# Patient Record
Sex: Male | Born: 1990 | Race: White | Hispanic: No | Smoking: Never smoker
Health system: Southern US, Community
[De-identification: ages and names within clinical notes are randomized; demographics above are authoritative.]

## PROBLEM LIST (undated history)

## (undated) DIAGNOSIS — J45909 Unspecified asthma, uncomplicated: Secondary | ICD-10-CM

## (undated) HISTORY — DX: Unspecified asthma, uncomplicated: J45.909

## (undated) HISTORY — PX: CHOLECYSTECTOMY: SHX55

## (undated) HISTORY — PX: WISDOM TOOTH EXTRACTION: SHX21

---
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
## 2014-02-23 ENCOUNTER — Ambulatory Visit (INDEPENDENT_AMBULATORY_CARE_PROVIDER_SITE_OTHER): Payer: 59

## 2014-02-23 VITALS — BP 113/81 | HR 67 | Resp 18

## 2014-02-23 DIAGNOSIS — B351 Tinea unguium: Secondary | ICD-10-CM

## 2014-02-23 MED ORDER — EFINACONAZOLE 10 % EX SOLN: CUTANEOUS | Status: DC

## 2014-02-23 NOTE — Progress Notes (Signed)
   Subjective:    Patient ID: Paul Rush, male    DOB: Feb 12, 1991, 23 y.o.   MRN: [id number]  HPI I have some toenails that have some fungus on all five of my nails on the right foot and has been going on for a year and used over the counter stuff and it hasn't gone away and they are thick and discoolored    Review of Systems  HENT: Positive for sinus pressure.   All other systems reviewed and are negative.      Objective:   Physical Exam Neurovascular status is intact pedal pulses palpable epicritic and proprioceptive sensations intact and unremarkable bilateral is normal plantar response and DTRs dermatologically skin color pigment and hair growth are normal nails on both feet and back difference the right foot has yellowing white thickening discoloration and subungual onychomycosis thick nails 1 through 5 of the right foot left foot has cleared normal unremarkable nails 1 through 5 nonpainful or symptomatic the right hallux nails and lesser digit nail shows been taking yellowing discoloration tenderness both on palpation patient has tried for 2-3 months of using topical antifungal therapy with little or no success or improvement. No secondary infections no ascending cellulitis or lymphangitis, orthopedic biomechanical exam otherwise unremarkable noncontributory rectus foot type otherwise noted       Assessment & Plan:  Assessment onychomycosis mycotic nails 1 through 5 right foot with dystrophy discoloration and brittleness and friability. Plan at this time discussed options for treatment including oral medications topical antifungals OTC a prescription was as well is laser and other treatment options at this time based on my recommendation per patient request I will initiate prescription for Julia topical nail antifungal prescriptions for it to help outline pharmacy and patient will initiate daily application each affected toenail 5 great or 5 toenails of the right foot was daily for  12 months recheck in 6-12 months for followup and reassessment device may take 6-12 months before any visible changes started to become evident contact us any difficulties or skin irritation from the medication otherwise follow instructions as indicated  Alvan Dame DPM

## 2014-02-23 NOTE — Patient Instructions (Signed)

## 2016-02-13 ENCOUNTER — Encounter: Payer: Self-pay | Admitting: Sports Medicine

## 2016-02-13 ENCOUNTER — Ambulatory Visit (INDEPENDENT_AMBULATORY_CARE_PROVIDER_SITE_OTHER): Payer: 59 | Admitting: Sports Medicine

## 2016-02-13 DIAGNOSIS — L603 Nail dystrophy: Secondary | ICD-10-CM | POA: Diagnosis not present

## 2016-02-13 DIAGNOSIS — M79671 Pain in right foot: Secondary | ICD-10-CM | POA: Diagnosis not present

## 2016-02-13 NOTE — Progress Notes (Signed)
Patient ID: Paul Rush, male   DOB: Dec 06, 1990, 25 y.o.   MRN: [id number] Subjective: Paul Rush is a 25 y.o. male patient seen today in office with complaint of painful thickened and discolored nails. Patient is desiring treatment for nail changes; has tried OTC topicals and Jublia Medication for 1 year with no improvement. Reports that nails are becoming difficult to manage because of the thickness on the right foot. Patient has no other pedal complaints at this time.   There are no active problems to display for this patient.   Current Outpatient Prescriptions on File Prior to Visit  Medication Sig Dispense Refill  . Efinaconazole (JUBLIA) 10 % SOLN Apply 1 drop each affected toenail once daily for 12 months 8 mL 6   No current facility-administered medications on file prior to visit.    No Known Allergies  Objective: Physical Exam  General: Well developed, nourished, no acute distress, awake, alert and oriented x 3  Vascular: Dorsalis pedis artery 2/4 bilateral, Posterior tibial artery 2/4 bilateral, skin temperature warm to warm proximal to distal bilateral lower extremities, no varicosities, pedal hair present bilateral.  Neurological: Gross sensation present via light touch bilateral.   Dermatological: Skin is warm, dry, and supple bilateral, Nails 1-5 on right are tender, short thick, and discolored with mild subungal debris, all other nails on left foot are within normal limits, no webspace macerations present bilateral, no open lesions present bilateral, no callus/corns/hyperkeratotic tissue present bilateral. No signs of infection bilateral.  Musculoskeletal: No boney deformities noted bilateral. Muscular strength within normal limits without painon range of motion. No pain with calf compression bilateral.  Assessment and Plan:  Problem List Items Addressed This Visit    None    Visit Diagnoses    Nail dystrophy    -  Primary    1-5, right foot    Relevant  Orders    Fungus Culture with Smear    Right foot pain          -Examined patient -Discussed treatment options for painful dystrophic nails  -[facility] right 1-5 toenails -Patient to return in 4 weeks for follow up evaluation and discussion of fungal culture results or sooner if symptoms worsen.  Asencion Islamitorya Phoenix Riesen, DPM

## 2016-03-12 ENCOUNTER — Ambulatory Visit (INDEPENDENT_AMBULATORY_CARE_PROVIDER_SITE_OTHER): Payer: 59 | Admitting: Sports Medicine

## 2016-03-12 ENCOUNTER — Encounter: Payer: Self-pay | Admitting: Sports Medicine

## 2016-03-12 DIAGNOSIS — B351 Tinea unguium: Secondary | ICD-10-CM | POA: Diagnosis not present

## 2016-03-12 DIAGNOSIS — M79671 Pain in right foot: Secondary | ICD-10-CM | POA: Diagnosis not present

## 2016-03-12 NOTE — Progress Notes (Signed)
Patient ID: Paul Rush, male   DOB: Feb 27, 1991, 25 y.o.   MRN: [id number] Subjective: Paul Rush is a 25 y.o. male patient seen today in office for fungal culture results. Patient has no other pedal complaints at this time.   There are no active problems to display for this patient.   Current Outpatient Prescriptions on File Prior to Visit  Medication Sig Dispense Refill  . chlorhexidine (PERIDEX) 0.12 % solution RINSE SS OZ TWICE DAILY AS DIRECTED  0  . Efinaconazole (JUBLIA) 10 % SOLN Apply 1 drop each affected toenail once daily for 12 months 8 mL 6  . HYDROcodone-acetaminophen (NORCO) 7.5-325 MG tablet TK 1 T PO  Q 4-6 H PRN P  0  . ibuprofen (ADVIL,MOTRIN) 600 MG tablet   0  . promethazine (PHENERGAN) 25 MG tablet TK 1 T PO  Q 6-8 H PRN FOR NAUSEA  0  . triazolam (HALCION) 0.25 MG tablet TAKE 1 TABLET THE EVENING PRIOR TO SURGERY AT BEDTIME AND SECOND TABLET 45 MINUTES PRIOR TO SURGERY  0   No current facility-administered medications on file prior to visit.    No Known Allergies  Objective: Physical Exam  General: Well developed, nourished, no acute distress, awake, alert and oriented x 3  Vascular: Dorsalis pedis artery 2/4 bilateral, Posterior tibial artery 2/4 bilateral, skin temperature warm to warm proximal to distal bilateral lower extremities, no varicosities, pedal hair present bilateral.  Neurological: Gross sensation present via light touch bilateral.   Dermatological: Skin is warm, dry, and supple bilateral, Nails 1-5 are tender, short thick, and discolored with mild subungal debris on right, all nails on left are within normal limits, no webspace macerations present bilateral, no open lesions present bilateral, no callus/corns/hyperkeratotic tissue present bilateral. No signs of infection bilateral.  Musculoskeletal: No boney deformities noted bilateral. Muscular strength within normal limits without painon range of motion. No pain with calf compression  bilateral.  Fungal culture + T. Rubrum  Assessment and Plan:  Problem List Items Addressed This Visit    None    Visit Diagnoses    Right foot pain    -  Primary    Nail fungus        Relevant Orders    Hepatic Function Panel      -Examined patient -Discussed treatment options for painful mycotic nails -Patient opt for oral Lamisil with full understanding of medication risks; ordered LFTs for review if within normal limits will proceed with sending Rx to pharmacy for lamisil 250mg  PO daily. Anticipate 12 week course.  -Advised good hygiene habits -Patient to return in 6 weeks for follow up evaluation or sooner if symptoms worsen.  Paul Rush, DPM

## 2016-03-19 ENCOUNTER — Ambulatory Visit (INDEPENDENT_AMBULATORY_CARE_PROVIDER_SITE_OTHER): Payer: 59 | Admitting: Neurology

## 2016-03-19 ENCOUNTER — Encounter: Payer: Self-pay | Admitting: Neurology

## 2016-03-19 VITALS — BP 120/72 | HR 86 | Resp 20 | Ht 66.0 in | Wt 140.0 lb

## 2016-03-19 DIAGNOSIS — R208 Other disturbances of skin sensation: Secondary | ICD-10-CM | POA: Diagnosis not present

## 2016-03-19 DIAGNOSIS — R6884 Jaw pain: Secondary | ICD-10-CM | POA: Diagnosis not present

## 2016-03-19 DIAGNOSIS — R200 Anesthesia of skin: Secondary | ICD-10-CM

## 2016-03-19 MED ORDER — CLONAZEPAM 0.5 MG PO TABS: 0.5000 mg | ORAL_TABLET | Freq: Every day | ORAL | Status: DC

## 2016-03-19 NOTE — Progress Notes (Signed)
Provider:  Melvyn Novas, M D  Referring Provider: Abigail Miyamoto,* Primary Care Physician:  Abigail Miyamoto, MD  Chief Complaint  Patient presents with  . New Patient (Initial Visit)    numbness on gumline after surgery    HPI:  Paul Rush is a 25 y.o. male  Is seen here as a referral from Dr. Ebbie Ridge in [place], DMD, DDS.      Paul Rush underwent on 02/18/2016 evaluation by Dr Ebbie Ridge  for atypical gum sensation. These affect the left and right anterior mandibular area a tingling likened to a numbing medication. It began more than 2 weeks after the extraction of his wisdom teeth had taken place. This was on 01/18/2016 the extraction was for bilateral third molars or wisdom teeth. He reportedly started having an abnormal sensation as soon as his aneasthesia wore off.  He was given a referral to an endodontist by his primary dentist  to rule out  dental origin of the discomfort and had been given prescription for muscle relaxant, this  made him just drowsy but did not significantly alter the sensory disturbance.   There were no intraoperative complications noted. The gingival tissues appeared unaffected. Dr. Ebbie Ridge than prescribed 100 mg of gabapentin 3 times a day which helped " to take the edge off "but did not completely resolve the symptoms. He had slowly titrated to a final level of 300 mg 3 times a day which completely resolved the discomfort but that result was short-lived as he tried to wean off gabapentin. The patient has not reported any numbness to the tongue mobile culture she seems to be strictly the gingiva. He also reports no change in his taste and smell sensations. Just a couple of days ago with discomfort was so painful in quality and intensity , he was nauseated. He likens the sensation to having food residual stuck between his teeth irritating the gumline. It is unpleasant to the level of being painful but there is also a  baseline numbness. By evaluating the oral cavity sensation with Q-tips I find no evidence that his buccal tissue is numb that he has lost taste or smell sensation, and the inner and outer side of the gumline is affected only at the mandibular branch.   Review of Systems: Out of a complete 14 system review, the patient complains of only the following symptoms, and all other reviewed systems are negative.  gum dysesthesia, hypersensitivity.  Hyperpathia  Social History   Social History  . Marital Status: Single    Spouse Name: N/A  . Number of Children: N/A  . Years of Education: N/A   Occupational History  . Not on file.   Social History Main Topics  . Smoking status: Never Smoker   . Smokeless tobacco: Never Used  . Alcohol Use: 1.2 oz/week    2 Standard drinks or equivalent per week  . Drug Use: No  . Sexual Activity: Not on file   Other Topics Concern  . Not on file   Social History Narrative   Drinks 1-2 cups of caffeine daily.    Family History  Problem Relation Age of Onset  . Healthy Mother     No past medical history on file.  Past Surgical History  Procedure Laterality Date  . Wisdom tooth extraction    . Cholecystectomy      Current Outpatient Prescriptions  Medication Sig Dispense Refill  . gabapentin (NEURONTIN) 300 MG capsule Take 300 mg by  mouth 3 (three) times daily.    Marland Kitchen ibuprofen (ADVIL,MOTRIN) 600 MG tablet   0   No current facility-administered medications for this visit.    Allergies as of 03/19/2016  . (No Known Allergies)    Vitals: BP 120/72 mmHg  Pulse 86  Resp 20  Ht 5\' 6"  (1.676 m)  Wt 140 lb (63.504 kg)  BMI 22.61 kg/m2 Last Weight:  Wt Readings from Last 1 Encounters:  03/19/16 140 lb (63.504 kg)   Last Height:   Ht Readings from Last 1 Encounters:  03/19/16 5\' 6"  (1.676 m)    Physical exam:  General: The patient is awake, alert and appears not in acute distress. The patient is well groomed. Head: Normocephalic,  atraumatic. Neck is supple. Mallampati 1 , neck circumference:14 Cardiovascular:  Regular rate and rhythm, without  murmurs or carotid bruit, and without distended neck veins. Respiratory: Lungs are clear to auscultation. Skin:  Without evidence of edema, or rash Trunk:  normal posture.  Neurologic exam : The patient is awake and alert, oriented to place and time.  Memory subjective  described as intact. There is a normal attention span & concentration ability. Speech is fluent without dysarthria, dysphonia or aphasia. There is no dysphagia reported, hisvoice hasn't changed Mood and affect are appropriate.  Cranial nerves: Pupils are equal and briskly reactive to light. Funduscopic exam without evidence of pallor or edema. Extraocular movements  in vertical and horizontal planes with coarse saccades, non-smooth pursuit.  and not a  nystagmus.  Visual fields by finger perimetry are intact. Hearing to finger rub intact.  Facial sensation intact to fine touch.   Facial motor strength is symmetric and tongue and uvula move midline.   Tongue protrusion into either cheek is normal. The numbness affects the gingiva at the inner and outer side of the lower jaw but does not ascend into the buccal Tissue. There is no numbness on the outside of the face. There is no numbness  involving the maxilla or soft palate. Shoulder shrug is normal.   Motor exam:   Normal tone ,muscle bulk and symmetric  strength in all extremities.  Sensory:  Fine touch, pinprick and vibration were tested in all extremities. Proprioception was normal.  Coordination: Rapid alternating movements in the fingers/hands were normal. Finger-to-nose maneuver  normal without evidence of ataxia, dysmetria or tremor.  Gait and station: Patient walks without assistive device and is able unassisted to climb up to the exam table. Strength within normal limits. Stance is stable and normal.   Assessment:  After physical and neurologic  examination, review of laboratory studies, imaging, neurophysiology testing and pre-existing records, assessment is that of :  Paul Rush reported the onset of the normal mandibular gum sensation right after the anesthesia wore off, and I don't think it is related to the dental extraction per se as much as it could be related to the anesthesia. If his mandibular branch nerve was damaged it is likely to recover after valerian degeneration at about 1 inch per month. This means that was in 2 or 3 months is gumline should just slowly regain normal sensation. I would very much like to obtain an MRI  of the brainstem, CN origin and down to the parotid gland. I would recommend this if no improvement is noted in 6 weeks, but the patient is anxious.     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       Porfirio Mylararmen Verl Whitmore MD 03/19/2016

## 2016-03-19 NOTE — Addendum Note (Signed)
Addended by: Margo AyeLOGAN, Ethaniel Garfield L on: 03/19/2016 04:00 PM   Modules accepted: Orders

## 2016-03-19 NOTE — Addendum Note (Signed)
Addended by: Melvyn NovasHMEIER, Dillian Feig on: 03/19/2016 02:52 PM   Modules accepted: Orders

## 2016-03-19 NOTE — Addendum Note (Signed)
Addended by: Geronimo RunningINKINS, Kashia Brossard A on: 03/19/2016 03:59 PM   Modules accepted: Orders

## 2016-03-21 LAB — CBC WITH DIFFERENTIAL/PLATELET
BASOS ABS: 0 10*3/uL (ref 0.0–0.2)
Basos: 0 %
EOS (ABSOLUTE): 0 10*3/uL (ref 0.0–0.4)
Eos: 1 %
HEMOGLOBIN: 16.5 g/dL (ref 12.6–17.7)
Hematocrit: 47.6 % (ref 37.5–51.0)
Immature Grans (Abs): 0 10*3/uL (ref 0.0–0.1)
Immature Granulocytes: 0 %
LYMPHS ABS: 2.3 10*3/uL (ref 0.7–3.1)
Lymphs: 33 %
MCH: 30.8 pg (ref 26.6–33.0)
MCHC: 34.7 g/dL (ref 31.5–35.7)
MCV: 89 fL (ref 79–97)
MONOCYTES: 6 %
MONOS ABS: 0.4 10*3/uL (ref 0.1–0.9)
NEUTROS ABS: 4.2 10*3/uL (ref 1.4–7.0)
Neutrophils: 60 %
Platelets: 217 10*3/uL (ref 150–379)
RBC: 5.36 x10E6/uL (ref 4.14–5.80)
RDW: 14 % (ref 12.3–15.4)
WBC: 7 10*3/uL (ref 3.4–10.8)

## 2016-03-21 LAB — COMPREHENSIVE METABOLIC PANEL
ALT: 12 IU/L (ref 0–44)
AST: 17 IU/L (ref 0–40)
Albumin/Globulin Ratio: 1.8 (ref 1.2–2.2)
Albumin: 5 g/dL (ref 3.5–5.5)
Alkaline Phosphatase: 82 IU/L (ref 39–117)
BUN/Creatinine Ratio: 12 (ref 9–20)
BUN: 10 mg/dL (ref 6–20)
Bilirubin Total: 1.2 mg/dL (ref 0.0–1.2)
CALCIUM: 10 mg/dL (ref 8.7–10.2)
CO2: 26 mmol/L (ref 18–29)
CREATININE: 0.81 mg/dL (ref 0.76–1.27)
Chloride: 97 mmol/L (ref 96–106)
GFR calc Af Amer: 141 mL/min/{1.73_m2} (ref >59)
GFR, EST NON AFRICAN AMERICAN: 122 mL/min/{1.73_m2} (ref >59)
GLOBULIN, TOTAL: 2.8 g/dL (ref 1.5–4.5)
GLUCOSE: 68 mg/dL (ref 65–99)
Potassium: 4.4 mmol/L (ref 3.5–5.2)
SODIUM: 140 mmol/L (ref 134–144)
Total Protein: 7.8 g/dL (ref 6.0–8.5)

## 2016-03-21 LAB — B. BURGDORFI ANTIBODIES

## 2016-03-24 ENCOUNTER — Telehealth: Payer: Self-pay

## 2016-03-24 NOTE — Telephone Encounter (Signed)
I spoke to Paul Rush and advised him that his labs were normal. Paul Rush asked that I sent a copy to Dr. Marina GoodellPerry, PCP. Paul Rush verbalized understanding of results. Paul Rush had no questions at this time but was encouraged to call back if questions arise.

## 2016-03-24 NOTE — Telephone Encounter (Signed)
-----   Message from Melvyn Novasarmen Dohmeier, MD sent at 03/21/2016  1:13 PM EDT ----- Mr Gregery NaKleisner has utstanding , normal CMET and CBC results. Lyme test negative - Copy to PCP, please. CD

## 2016-03-27 ENCOUNTER — Telehealth: Payer: Self-pay | Admitting: Neurology

## 2016-03-27 NOTE — Telephone Encounter (Signed)
Pt called in and states that his pain is moving to right side of jaw and down his neck. Sleep medication is not working and says that he is in pain all the time. Please call and advise [phone]

## 2016-03-27 NOTE — Telephone Encounter (Signed)
I spoke to pt. He says that the gabapentin does help him, but he still having pain that is bothersome to him. He would be willing to try an increase in gabapentin dose if Dr. Terrace ArabiaYan feels this is appropriate.

## 2016-03-27 NOTE — Telephone Encounter (Signed)
I spoke to pt. He is having pain now in his right jaw and down his right next. Pt woke up this morning and was nauseous from the pain. He says that the klonopin 0.5mg  tablet qhs does not help his pain, only makes him relax before bedtime. Pt feels that the gabapentin has not been beneficial for his pain. He has not been scheduled for his MRI.   Pt is wondering if there is anything else he can take for his pain. I advised him that Dr. Vickey Hugerohmeier is out of the office until mid July and I will send this request to the work-in physician. Pt verbalized understanding.  Duwayne HeckDanielle, can you please get this pt scheduled for his MRI? He says that he has not heard anything from our office regarding that yet.

## 2016-03-27 NOTE — Telephone Encounter (Addendum)
Chart reviewed, patient was seen by Dr. Vickey Hugerohmeier on June 21st 2017, for gumline neuropathic pain, he was given prescription of gabapentin 300 mg 3 times a day with limited help  Please check to see if gabapentin help him at all, it is helping him some, he still have a lot of room to push up the dosage, he may take gabapentin 300 mg 2 tablets 3 times a day,  If gabapentin does not help him at all, the other option would be switched to different medication such as Lyrica, which is a brand name medication, 100 mg 3 times a day.

## 2016-03-28 MED ORDER — GABAPENTIN 300 MG PO CAPS: 600.0000 mg | ORAL_CAPSULE | Freq: Three times a day (TID) | ORAL | Status: DC

## 2016-03-28 NOTE — Telephone Encounter (Signed)
I spoke to pt and realyed the instructions per below and new prescription called in for him.  Gabapentin 300mg  2 tabs po tid.   He verbalized understanding.

## 2016-03-28 NOTE — Addendum Note (Signed)
Addended by: Levert FeinsteinYAN, Della Homan on: 03/28/2016 10:08 AM   Modules accepted: Orders

## 2016-03-28 NOTE — Telephone Encounter (Signed)
Please call patient, it is ok to take higher dose of gabapentin, I have called in Gabapentin 300 mg 2 tabs tid.

## 2016-04-07 NOTE — Telephone Encounter (Signed)
[facility]

## 2016-04-09 ENCOUNTER — Ambulatory Visit (INDEPENDENT_AMBULATORY_CARE_PROVIDER_SITE_OTHER): Payer: 59 | Admitting: Adult Health

## 2016-04-09 ENCOUNTER — Encounter: Payer: Self-pay | Admitting: Adult Health

## 2016-04-09 VITALS — BP 108/59 | HR 77 | Ht 66.0 in | Wt 148.8 lb

## 2016-04-09 DIAGNOSIS — R208 Other disturbances of skin sensation: Secondary | ICD-10-CM

## 2016-04-09 DIAGNOSIS — R200 Anesthesia of skin: Secondary | ICD-10-CM

## 2016-04-09 NOTE — Progress Notes (Signed)
PATIENT: Paul Rush DOB: 04-17-1991  REASON FOR VISIT: follow up- sensory loss HISTORY FROM: patient  HISTORY OF PRESENT ILLNESS: Paul Rush is a 25 year old male with a history of sensory changes in the mandibular area following dental surgery. He returns today for follow-up. He states that he called in earlier regarding discomfort and gabapentin was increased. He states that this has been very beneficial. He states that he continues to have some discomfort but it is tolerable. He continues to use Klonopin at bedtime with good benefit. Patient has an MRI scheduled for Saturday. He denies any new neurological symptoms. Returns today for an evaluation.   HISTORY 03/19/16 [facility]):  Paul Rush underwent on 02/18/2016 evaluation by Dr Ebbie Ridge for atypical gum sensation. These affect the left and right anterior mandibular area a tingling likened to a numbing medication. It began more than 2 weeks after the extraction of his wisdom teeth had taken place. This was on 01/18/2016 the extraction was for bilateral third molars or wisdom teeth. He reportedly started having an abnormal sensation as soon as his aneasthesia wore off.  He was given a referral to an endodontist by his primary dentist to rule out dental origin of the discomfort and had been given prescription for muscle relaxant, this made him just drowsy but did not significantly alter the sensory disturbance.  There were no intraoperative complications noted. The gingival tissues appeared unaffected. Dr. Ebbie Ridge than prescribed 100 mg of gabapentin 3 times a day which helped " to take the edge off "but did not completely resolve the symptoms. He had slowly titrated to a final level of 300 mg 3 times a day which completely resolved the discomfort but that result was short-lived as he tried to wean off gabapentin. The patient has not reported any numbness to the tongue mobile culture she seems to be strictly the gingiva.  He also reports no change in his taste and smell sensations. Just a couple of days ago with discomfort was so painful in quality and intensity , he was nauseated. He likens the sensation to having food residual stuck between his teeth irritating the gumline. It is unpleasant to the level of being painful but there is also a baseline numbness. By evaluating the oral cavity sensation with Q-tips I find no evidence that his buccal tissue is numb that he has lost taste or smell sensation, and the inner and outer side of the gumline is affected only at the mandibular branch.  REVIEW OF SYSTEMS: Out of a complete 14 system review of symptoms, the patient complains only of the following symptoms, and all other reviewed systems are negative.  See history of present illness  ALLERGIES: No Known Allergies  HOME MEDICATIONS: Outpatient Prescriptions Prior to Visit  Medication Sig Dispense Refill  . clonazePAM (KLONOPIN) 0.5 MG tablet Take 1 tablet (0.5 mg total) by mouth at bedtime. 30 tablet 0  . gabapentin (NEURONTIN) 300 MG capsule Take 2 capsules (600 mg total) by mouth 3 (three) times daily. 180 capsule 6  . ibuprofen (ADVIL,MOTRIN) 600 MG tablet   0   No facility-administered medications prior to visit.    PAST MEDICAL HISTORY: No past medical history on file.  PAST SURGICAL HISTORY: Past Surgical History  Procedure Laterality Date  . Wisdom tooth extraction    . Cholecystectomy      FAMILY HISTORY: Family History  Problem Relation Age of Onset  . Healthy Mother     SOCIAL HISTORY: Social History   Social  History  . Marital Status: Single    Spouse Name: N/A  . Number of Children: N/A  . Years of Education: N/A   Occupational History  . Not on file.   Social History Main Topics  . Smoking status: Never Smoker   . Smokeless tobacco: Never Used  . Alcohol Use: 1.2 oz/week    2 Standard drinks or equivalent per week  . Drug Use: No  . Sexual Activity: Not on file    Other Topics Concern  . Not on file   Social History Narrative   Drinks 1-2 cups of caffeine daily.      PHYSICAL EXAM  Filed Vitals:   04/09/16 0856  BP: 108/59  Pulse: 77  Height: 5\' 6"  (1.676 m)  Weight: 148 lb 12.8 oz (67.495 kg)   Body mass index is 24.03 kg/(m^2).  Generalized: Well developed, in no acute distress   Neurological examination  Mentation: Alert oriented to time, place, history taking. Follows all commands speech and language fluent Cranial nerve II-XII: Pupils were equal round reactive to light. Extraocular movements were full, visual field were full on confrontational test. Facial sensation and strength were normal. Uvula tongue midline. Head turning and shoulder shrug  were normal and symmetric. Motor: The motor testing reveals 5 over 5 strength of all 4 extremities. Good symmetric motor tone is noted throughout.  Sensory: Sensory testing is intact to soft touch on all 4 extremities. No evidence of extinction is noted.  Coordination: Cerebellar testing reveals good finger-nose-finger and heel-to-shin bilaterally.  Gait and station: Gait is normal. Tandem gait is normal. Romberg is negative. No drift is seen.  Reflexes: Deep tendon reflexes are symmetric and normal bilaterally.   DIAGNOSTIC DATA (LABS, IMAGING, TESTING) - I reviewed patient records, labs, notes, testing and imaging myself where available.  Lab Results  Component Value Date   WBC 7.0 03/19/2016   HCT 47.6 03/19/2016   MCV 89 03/19/2016   PLT 217 03/19/2016      Component Value Date/Time   NA 140 03/19/2016 1525   K 4.4 03/19/2016 1525   CL 97 03/19/2016 1525   CO2 26 03/19/2016 1525   GLUCOSE 68 03/19/2016 1525   BUN 10 03/19/2016 1525   CREATININE 0.81 03/19/2016 1525   CALCIUM 10.0 03/19/2016 1525   PROT 7.8 03/19/2016 1525   ALBUMIN 5.0 03/19/2016 1525   AST 17 03/19/2016 1525   ALT 12 03/19/2016 1525   ALKPHOS 82 03/19/2016 1525   BILITOT 1.2 03/19/2016 1525    GFRNONAA 122 03/19/2016 1525   GFRAA 141 03/19/2016 1525      ASSESSMENT AND PLAN 25 y.o. year old male  has no past medical history on file. here with:  1. Sensory loss  The patient's physical exam was relatively unremarkable. He will continue on gabapentin 600 mg 3 times a day. Continue on Klonopin at bedtime. He has his MRI scheduled for Saturday. We will call him with results once they are available to us. Patient advised that if his symptoms worsen or he develops any new symptoms he should let us know. Will follow-up in 3 months or sooner if needed.    Butch PennyMegan Haiden Clucas, MSN, NP-C 04/09/2016, 9:11 AM [facility] [address], [address] [place], [place] [address] [phone]

## 2016-04-09 NOTE — Progress Notes (Signed)
I agree with the assessment and plan as directed by NP .The patient is known to me .   Hind Chesler, MD  

## 2016-04-09 NOTE — Patient Instructions (Signed)
Continue gabapentin and klonopin  If your symptoms worsen or you develop new symptoms please let us know.

## 2016-04-12 ENCOUNTER — Ambulatory Visit
Admission: RE | Admit: 2016-04-12 | Discharge: 2016-04-12 | Disposition: A | Discharge status: Home / Self Care | Payer: 59 | Source: Ambulatory Visit | Attending: Neurology | Admitting: Neurology

## 2016-04-12 DIAGNOSIS — R208 Other disturbances of skin sensation: Secondary | ICD-10-CM | POA: Diagnosis not present

## 2016-04-12 DIAGNOSIS — R6884 Jaw pain: Secondary | ICD-10-CM

## 2016-04-12 DIAGNOSIS — R200 Anesthesia of skin: Secondary | ICD-10-CM

## 2016-04-12 MED ORDER — GADOBENATE DIMEGLUMINE 529 MG/ML IV SOLN
13.0000 mL | Freq: Once | INTRAVENOUS | Status: AC | PRN
  Administered 2016-04-12: 13 mL via INTRAVENOUS

## 2016-04-16 ENCOUNTER — Telehealth: Payer: Self-pay

## 2016-04-16 NOTE — Telephone Encounter (Signed)
-----   Message from Melvyn Novasarmen Dohmeier, MD sent at 04/16/2016 11:29 AM EDT ----- normal brain MRI, CD

## 2016-04-16 NOTE — Telephone Encounter (Signed)
I spoke to patient and he is aware of results/.  

## 2016-04-23 ENCOUNTER — Ambulatory Visit (INDEPENDENT_AMBULATORY_CARE_PROVIDER_SITE_OTHER): Payer: 59 | Admitting: Sports Medicine

## 2016-04-23 ENCOUNTER — Encounter: Payer: Self-pay | Admitting: Sports Medicine

## 2016-04-23 DIAGNOSIS — L603 Nail dystrophy: Secondary | ICD-10-CM

## 2016-04-23 DIAGNOSIS — R61 Generalized hyperhidrosis: Secondary | ICD-10-CM

## 2016-04-23 DIAGNOSIS — B351 Tinea unguium: Secondary | ICD-10-CM | POA: Diagnosis not present

## 2016-04-23 DIAGNOSIS — L74519 Primary focal hyperhidrosis, unspecified: Secondary | ICD-10-CM

## 2016-04-23 DIAGNOSIS — M79671 Pain in right foot: Secondary | ICD-10-CM

## 2016-04-23 MED ORDER — TERBINAFINE HCL 250 MG PO TABS: 250.0000 mg | ORAL_TABLET | Freq: Every day | ORAL | 0 refills | Status: DC

## 2016-04-23 NOTE — Patient Instructions (Signed)
Preventing Toenail Fungus from Recurring   Sanitize your shoes with Mycomist spray or a similar shoe sanitizer spray.  Follow the instructions on the bottle and dry them outside in the sun or with a hairdryer.  We also recommend repeating the sanitization once weekly in shoes you wear most often.   Throw away any shoes you have worn a significant amount without socks-fungus thrives in a warm moist environment and you want to avoid re-infection after your laser procedure   Bleach your socks with regular or color safe bleach   Change your socks regularly to keep your feet clean and dry (especially if you have sweaty feet)-if sweaty feet are a problem, let your doctor know-there is a great lotion that helps with this problem.   Clean your toenail clippers with alcohol before you use them if you do your own toenails and make sure to replace [employer] boards and orange sticks regularly   If you get regular pedicures, bring your own instruments or go to a spa that sterilizes their instruments in an autoclave.  Onychomycosis/Fungal Toenails  WHAT IS IT? An infection that lies within the keratin of your nail plate that is caused by a fungus.  WHY ME? Fungal infections affect all ages, sexes, races, and creeds.  There may be many factors that predispose you to a fungal infection such as age, coexisting medical conditions such as diabetes, or an autoimmune disease; stress, medications, fatigue, genetics, etc.  Bottom line: fungus thrives in a warm, moist environment and your shoes offer such a location.  IS IT CONTAGIOUS? Theoretically, yes.  You do not want to share shoes, nail clippers or files with someone who has fungal toenails.  Walking around barefoot in the same room or sleeping in the same bed is unlikely to transfer the organism.  It is important to realize, however, that fungus can spread easily from one nail to the next on the same foot.  HOW DO WE TREAT THIS?  There are several ways to  treat this condition.  Treatment may depend on many factors such as age, medications, pregnancy, liver and kidney conditions, etc.  It is best to ask your doctor which options are available to you.  2. No treatment.   Unlike many other medical concerns, you can live with this condition.  However for many people this can be a painful condition and may lead to ingrown toenails or a bacterial infection.  It is recommended that you keep the nails cut short to help reduce the amount of fungal nail. 3. Topical treatment.  These range from herbal remedies to prescription strength nail lacquers.  About 40-50% effective, topicals require twice daily application for approximately 9 to 12 months or until an entirely new nail has grown out.  The most effective topicals are medical grade medications available through physicians offices. 4. Oral antifungal medications.  With an 80-90% cure rate, the most common oral medication requires 3 to 4 months of therapy and stays in your system for a year as the new nail grows out.  Oral antifungal medications do require blood work to make sure it is a safe drug for you.  A liver function panel will be performed prior to starting the medication and after the first month of treatment.  It is important to have the blood work performed to avoid any harmful side effects.  In general, this medication safe but blood work is required. 5. Laser Therapy.  This treatment is performed by applying a specialized   laser to the affected nail plate.  This therapy is noninvasive, fast, and non-painful.  It is not covered by insurance and is therefore, out of pocket.  The results have been very good with a 80-95% cure rate.  The [facility] is the only practice in the area to offer this therapy. 6. Permanent Nail Avulsion.  Removing the entire nail so that a new nail will not grow back.  

## 2016-04-23 NOTE — Progress Notes (Signed)
Subjective: [name] is a 25 y.o. male patient seen today in office for follow evaluation of right nail fungus and fungal culture results. Patient has no other pedal complaints at this time.   There are no active problems to display for this patient.   Current Outpatient Prescriptions on File Prior to Visit  Medication Sig Dispense Refill  . clonazePAM (KLONOPIN) 0.5 MG tablet Take 1 tablet (0.5 mg total) by mouth at bedtime. 30 tablet 0  . gabapentin (NEURONTIN) 300 MG capsule Take 2 capsules (600 mg total) by mouth 3 (three) times daily. 180 capsule 6  . ibuprofen (ADVIL,MOTRIN) 600 MG tablet   0   No current facility-administered medications on file prior to visit.     No Known Allergies  Objective: Physical Exam  General: Well developed, nourished, no acute distress, awake, alert and oriented x 3  Vascular: Dorsalis pedis artery 2/4 bilateral, Posterior tibial artery 2/4 bilateral, skin temperature warm to warm proximal to distal bilateral lower extremities, no varicosities, pedal hair present bilateral. Mild increased moisture to both feet suggestive of hyperhydrosis.  Neurological: Gross sensation present via light touch bilateral.   Dermatological: Skin is warm, dry, and supple bilateral, Nails 1-10 are tender, short thick, and discolored with mild subungal debris R>L, no webspace macerations present bilateral, no open lesions present bilateral, no callus/corns/hyperkeratotic tissue present bilateral. No signs of infection bilateral.  Musculoskeletal: No symptomatic boney deformities noted bilateral. Muscular strength within normal limits without painon range of motion. No pain with calf compression bilateral.  Fungal culture + T Rubrum  Assessment and Plan:  Problem List Items Addressed This Visit    None    Visit Diagnoses    Nail fungus    -  Primary   Relevant Medications   terbinafine (LAMISIL) 250 MG tablet   Nail dystrophy       Right foot pain       Hyperhydrosis disorder         -Examined patient -Discussed treatment options for painful mycotic nails -Patient opt for oral Lamisil with full understanding of medication risks; LFTs from neurologist within normal limits will proceed with sending Rx to pharmacy for lamisil 250mg  PO daily. Anticipate 12 week course.  -Patient to continue with topical jublia as well of which he already has -Advised good hygiene habits -Patient to return in 3-4 months when new insurance is active for follow up evaluation or sooner if symptoms worsen.  Asencion Islam, DPM

## 2016-07-10 ENCOUNTER — Ambulatory Visit: Payer: 59 | Admitting: Adult Health

## 2018-04-06 ENCOUNTER — Emergency Department (HOSPITAL_COMMUNITY): Payer: BC Managed Care – PPO

## 2018-04-06 ENCOUNTER — Emergency Department (HOSPITAL_COMMUNITY)
Admission: EM | Admit: 2018-04-06 | Discharge: 2018-04-06 | Disposition: A | Discharge status: Home / Self Care | Payer: BC Managed Care – PPO | Attending: Emergency Medicine | Admitting: Emergency Medicine

## 2018-04-06 ENCOUNTER — Encounter (HOSPITAL_COMMUNITY): Payer: Self-pay

## 2018-04-06 DIAGNOSIS — R079 Chest pain, unspecified: Secondary | ICD-10-CM | POA: Insufficient documentation

## 2018-04-06 DIAGNOSIS — K529 Noninfective gastroenteritis and colitis, unspecified: Secondary | ICD-10-CM | POA: Insufficient documentation

## 2018-04-06 DIAGNOSIS — R509 Fever, unspecified: Secondary | ICD-10-CM | POA: Diagnosis not present

## 2018-04-06 DIAGNOSIS — R109 Unspecified abdominal pain: Secondary | ICD-10-CM | POA: Diagnosis not present

## 2018-04-06 DIAGNOSIS — R197 Diarrhea, unspecified: Secondary | ICD-10-CM | POA: Diagnosis present

## 2018-04-06 LAB — BASIC METABOLIC PANEL
Anion gap: 14 (ref 5–15)
BUN: 11 mg/dL (ref 6–20)
CO2: 23 mmol/L (ref 22–32)
CREATININE: 0.93 mg/dL (ref 0.61–1.24)
Calcium: 9.6 mg/dL (ref 8.9–10.3)
Chloride: 102 mmol/L (ref 98–111)
Glucose, Bld: 84 mg/dL (ref 70–99)
Potassium: 3.4 mmol/L — ABNORMAL LOW (ref 3.5–5.1)
SODIUM: 139 mmol/L (ref 135–145)

## 2018-04-06 LAB — D-DIMER, QUANTITATIVE (NOT AT ARMC)

## 2018-04-06 LAB — I-STAT TROPONIN, ED: TROPONIN I, POC: 0 ng/mL (ref 0.00–0.08)

## 2018-04-06 LAB — CBC
HCT: 42.5 % (ref 39.0–52.0)
Hemoglobin: 14.2 g/dL (ref 13.0–17.0)
MCH: 29.7 pg (ref 26.0–34.0)
MCHC: 33.4 g/dL (ref 30.0–36.0)
MCV: 88.9 fL (ref 78.0–100.0)
PLATELETS: 228 10*3/uL (ref 150–400)
RBC: 4.78 MIL/uL (ref 4.22–5.81)
RDW: 12.4 % (ref 11.5–15.5)
WBC: 13.6 10*3/uL — AB (ref 4.0–10.5)

## 2018-04-06 MED ORDER — SODIUM CHLORIDE 0.9 % IV BOLUS
1000.0000 mL | Freq: Once | INTRAVENOUS | Status: AC
  Administered 2018-04-06: 1000 mL via INTRAVENOUS

## 2018-04-06 MED ORDER — ACETAMINOPHEN 500 MG PO TABS
1000.0000 mg | ORAL_TABLET | Freq: Once | ORAL | Status: AC
  Administered 2018-04-06: 1000 mg via ORAL
  Filled 2018-04-06: qty 2

## 2018-04-06 MED ORDER — KETOROLAC TROMETHAMINE 15 MG/ML IJ SOLN
15.0000 mg | Freq: Once | INTRAMUSCULAR | Status: AC
  Administered 2018-04-06: 15 mg via INTRAVENOUS
  Filled 2018-04-06: qty 1

## 2018-04-06 MED ORDER — METRONIDAZOLE 500 MG PO TABS: 500.0000 mg | ORAL_TABLET | Freq: Three times a day (TID) | ORAL | 0 refills | Status: AC

## 2018-04-06 MED ORDER — IOHEXOL 300 MG/ML  SOLN
100.0000 mL | Freq: Once | INTRAMUSCULAR | Status: AC | PRN
  Administered 2018-04-06: 100 mL via INTRAVENOUS

## 2018-04-06 MED ORDER — CIPROFLOXACIN HCL 500 MG PO TABS: 500.0000 mg | ORAL_TABLET | Freq: Two times a day (BID) | ORAL | 0 refills | Status: AC

## 2018-04-06 MED ORDER — POTASSIUM CHLORIDE CRYS ER 20 MEQ PO TBCR
40.0000 meq | EXTENDED_RELEASE_TABLET | Freq: Once | ORAL | Status: AC
Start: 2018-04-06 — End: 2018-04-06
  Administered 2018-04-06: 40 meq via ORAL
  Filled 2018-04-06: qty 2

## 2018-04-06 NOTE — ED Provider Notes (Signed)
[facility] EMERGENCY DEPARTMENT Provider Note   CSN: [id number] Arrival date & time: 04/06/18  1532     History   Chief Complaint Chief Complaint  Patient presents with  . Chest Pain    HPI Paul Rush is a 27 y.o. male.  HPI 27 year old male with no significant past medical history here with fever, nausea, and diarrhea.  Patient states that for the last week, he has had persistent diarrhea.  He traveled to [place] several weeks ago, and has had intermittent diarrhea since then.  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  He does endorse occasional shortness of breath and chest pain.  He has had fever, fatigue, and chills.  Denies any weight loss or night sweats.  Denies any high risk sexual activity or HIV exposures.  No known history of tuberculosis.  He endorses intermittent cramp-like abdominal pain.  No alleviating factors.  No aggravating factors in particular.   History reviewed. No pertinent past medical history.  There are no active problems to display for this patient.   Past Surgical History:  Procedure Laterality Date  . CHOLECYSTECTOMY    . WISDOM TOOTH EXTRACTION    . WISDOM TOOTH EXTRACTION          Home Medications    Prior to Admission medications   Medication Sig Start Date End Date Taking? Authorizing Provider  ciprofloxacin (CIPRO) 500 MG tablet Take 1 tablet (500 mg total) by mouth 2 (two) times daily for 7 days. 04/06/18 04/13/18  Shaune Pollack, MD  clonazePAM (KLONOPIN) 0.5 MG tablet Take 1 tablet (0.5 mg total) by mouth at bedtime. Patient not taking: Reported on 04/06/2018 03/19/16   Dohmeier, Porfirio Mylar, MD  gabapentin (NEURONTIN) 300 MG capsule Take 2 capsules (600 mg total) by mouth 3 (three) times daily. Patient not taking: Reported on 04/06/2018 03/28/16   Levert Feinstein, MD  metroNIDAZOLE (FLAGYL) 500 MG tablet Take 1 tablet (500 mg total) by mouth 3 (three) times daily  for 7 days. 04/06/18 04/13/18  Shaune Pollack, MD  terbinafine (LAMISIL) 250 MG tablet Take 1 tablet (250 mg total) by mouth daily. Patient not taking: Reported on 04/06/2018 04/23/16   Asencion Islam, DPM    Family History Family History  Problem Relation Age of Onset  . Healthy Mother     Social History Social History   Tobacco Use  . Smoking status: Never Smoker  . Smokeless tobacco: Never Used  Substance Use Topics  . Alcohol use: Yes    Alcohol/week: 1.2 oz    Types: 2 Standard drinks or equivalent per week  . Drug use: No     Allergies   Patient has no known allergies.   Review of Systems Review of Systems  Constitutional: Negative for chills, fatigue and fever.  HENT: Negative for congestion and rhinorrhea.   Eyes: Negative for visual disturbance.  Respiratory: Positive for chest tightness. Negative for cough, shortness of breath and wheezing.   Cardiovascular: Positive for chest pain. Negative for leg swelling.  Gastrointestinal: Positive for abdominal pain, diarrhea, nausea and vomiting.  Genitourinary: Negative for dysuria and flank pain.  Musculoskeletal: Negative for neck pain and neck stiffness.  Skin: Negative for rash and wound.  Allergic/Immunologic: Negative for immunocompromised state.  Neurological: Positive for weakness. Negative for syncope and headaches.  All other systems reviewed and are negative.    Physical Exam Updated Vital Signs BP 109/74  Pulse 94   Temp 98.3 F (36.8 C) (Oral)   Resp 11   Ht 5\' 6"  (1.676 m)   Wt 63.5 kg (140 lb)   SpO2 96%   BMI 22.60 kg/m   Physical Exam  Constitutional: He is oriented to person, place, and time. He appears well-developed and well-nourished. No distress.  HENT:  Head: Normocephalic and atraumatic.  Eyes: Conjunctivae are normal.  Neck: Neck supple.  Cardiovascular: Normal rate, regular rhythm and normal heart sounds. Exam reveals no friction rub.  No murmur heard. Pulmonary/Chest: Effort  normal and breath sounds normal. No respiratory distress. He has no wheezes. He has no rales.  Abdominal: He exhibits no distension.  Musculoskeletal: He exhibits no edema.  Neurological: He is alert and oriented to person, place, and time. He exhibits normal muscle tone.  Skin: Skin is warm. Capillary refill takes less than 2 seconds.  Psychiatric: He has a normal mood and affect.  Nursing note and vitals reviewed.    ED Treatments / Results  Labs (all labs ordered are listed, but only abnormal results are displayed) Labs Reviewed  BASIC METABOLIC PANEL - Abnormal; Notable for the following components:      Result Value   Potassium 3.4 (*)    All other components within normal limits  CBC - Abnormal; Notable for the following components:   WBC 13.6 (*)    All other components within normal limits  D-DIMER, QUANTITATIVE (NOT AT [facility])  I-STAT TROPONIN, ED    EKG EKG Interpretation  Date/Time:  Tuesday April 06 2018 15:40:12 EDT Ventricular Rate:  98 PR Interval:  140 QRS Duration: 108 QT Interval:  330 QTC Calculation: 421 R Axis:   106 Text Interpretation:  Normal sinus rhythm Rightward axis Incomplete right bundle branch block Borderline ECG No old tracing to compare Confirmed by Shaune PollackIsaacs, Theressa Piedra [phone]([id number]) on 04/06/2018 11:35:30 PM   Radiology Dg Chest 2 View  Result Date: 04/06/2018 CLINICAL DATA:  Intermittent episodes of mid sternal chest pain and dizziness and upper extremity tingling since yesterday. No shortness of breath. History of childhood asthma. EXAM: CHEST - 2 VIEW COMPARISON:  None in PACs FINDINGS: The lungs are adequately inflated. There is no pneumothorax or pneumomediastinum. The interstitial markings are mildly prominent. There is no alveolar infiltrate or pleural effusion. The heart and pulmonary vascularity are normal. Prominent soft tissue density overlying the posterior heart border on the lateral view is felt reflect a confluence of lower lobe vessels  bilaterally. Now the mediastinum is normal in width. There is no pleural effusion. The bony thorax exhibits no acute abnormality. IMPRESSION: Mild prominence of the pulmonary interstitium may reflect the sequelae of reactive airway disease. There is no alveolar pneumonia nor CHF. Electronically Signed   By: David  Swazil[place] M.D.   On: 04/06/2018 16:45   Ct Abdomen Pelvis W Contrast  Result Date: 04/06/2018 CLINICAL DATA:  Nausea and vomiting.  Abdominal pain. EXAM: CT ABDOMEN AND PELVIS WITH CONTRAST TECHNIQUE: Multidetector CT imaging of the abdomen and pelvis was performed using the standard protocol following bolus administration of intravenous contrast. CONTRAST:  100mL OMNIPAQUE IOHEXOL 300 MG/ML  SOLN COMPARISON:  None. FINDINGS: Lower chest: The lung bases are clear. Hepatobiliary: No focal liver abnormality is seen. Status post cholecystectomy. No biliary dilatation. Pancreas: No ductal dilatation or inflammation. Spleen: Normal in size without focal abnormality. Adrenals/Urinary Tract: Normal adrenal glands. No hydronephrosis or perinephric edema. Homogeneous renal enhancement. Urinary bladder is physiologically distended without wall thickening. Stomach/Bowel: Stomach is nondistended,  no gastric wall thickening. Fluid-filled distal small bowel without wall thickening, inflammatory change or obstruction. Fluid in the ascending colon without colonic wall thickening. Normal appendix. Small volume of stool in the more distal colon. Vascular/Lymphatic: Few prominent central and ileocolic mesenteric nodes not enlarged by size criteria. No acute or suspicious vascular findings. Reproductive: Prostate is unremarkable. Other: No free air, free fluid, or intra-abdominal fluid collection. Tiny fat containing umbilical hernia. Musculoskeletal: There are no acute or suspicious osseous abnormalities. IMPRESSION: 1. Fluid-filled distal small bowel and ascending colon can be seen with generalized enteritis. No bowel  inflammatory change or obstruction. 2. No other explanation for abdominal pain. Electronically Signed   By: Rubye Oaks M.D.   On: 04/06/2018 21:07    Procedures Procedures (including critical care time)  Medications Ordered in ED Medications  acetaminophen (TYLENOL) tablet 1,000 mg (1,000 mg Oral Given 04/06/18 2009)  sodium chloride 0.9 % bolus 1,000 mL (0 mLs Intravenous Stopped 04/06/18 2204)  sodium chloride 0.9 % bolus 1,000 mL (0 mLs Intravenous Stopped 04/06/18 2205)  ketorolac (TORADOL) 15 MG/ML injection 15 mg (15 mg Intravenous Given 04/06/18 2010)  iohexol (OMNIPAQUE) 300 MG/ML solution 100 mL (100 mLs Intravenous Contrast Given 04/06/18 2042)  potassium chloride SA (K-DUR,KLOR-CON) CR tablet 40 mEq (40 mEq Oral Given 04/06/18 2211)     Initial Impression / Assessment and Plan / ED Course  I have reviewed the triage vital signs and the nursing notes.  Pertinent labs & imaging results that were available during my care of the patient were reviewed by me and considered in my medical decision making (see chart for details).     27 year old male here with intermittent chest pain, nausea, vomiting, diarrhea, since recent travel to [place].  Patient febrile here but otherwise is very well-appearing.  He has no evidence of sepsis.  He does have some diffuse tenderness to CT abdomen and pelvis obtained and is negative for appendicitis.  His imaging is consistent with enterocolitis which fits with his story.  Given his recent travel to [place], will treat him with Cipro and Flagyl.  No blood in his stools, anemia, or evidence to suggest hemorrhagic E. coli.  Regarding his questionable abnormal EKG, he has an incomplete right bundle which is suspect is at his baseline.  His chest pain is more so secondary to his nausea and abdominal cramping, and he has a normal EKG, negative d-dimer, and I do not suspect ACS, PE, or dissection.  Final Clinical Impressions(s) / ED Diagnoses   Final diagnoses:    Fever in adult  Enterocolitis    ED Discharge Orders        Ordered    ciprofloxacin (CIPRO) 500 MG tablet  2 times daily     04/06/18 2203    metroNIDAZOLE (FLAGYL) 500 MG tablet  3 times daily     04/06/18 2203       Shaune Pollack, MD 04/06/18 2336

## 2018-04-06 NOTE — ED Triage Notes (Signed)
Pt presents with onset of mid-sternal chest pain, dizziness and BUE tingling since yesterday.  Pt reports pain is intermittent and does not radiate. Pt was seen at student health at [employer] with EKG showing RBBB.  Pt denies any shortness of breath.

## 2018-04-06 NOTE — ED Provider Notes (Signed)
Patient placed in Quick Look pathway, seen and evaluated   Chief Complaint: irregular EKG  HPI:   Report not feeling well yesterday with mild chest discomfort, fatigue , subjective fever.  [facility] and sent here for irregular EKG  ROS:  No SOB, dizziness(one)  Physical Exam:   Gen: No distress  Neuro: Awake and Alert  Skin: Warm    Focused Exam: NAD, heart RRR, no MRG, lungs CTAB, abd soft/non tender.    Initiation of care has begun. The patient has been counseled on the process, plan, and necessity for staying for the completion/evaluation, and the remainder of the medical screening examination    Fayrene Helperran, Shaindy Reader, Cordelia Poche-C 04/06/18 1542    Eber HongMiller, Brian, MD 04/09/18 [phone]

## 2018-07-20 ENCOUNTER — Ambulatory Visit: Payer: BC Managed Care – PPO | Admitting: Sports Medicine

## 2018-12-13 ENCOUNTER — Other Ambulatory Visit: Payer: Self-pay

## 2018-12-13 ENCOUNTER — Other Ambulatory Visit (HOSPITAL_COMMUNITY)
Admission: RE | Admit: 2018-12-13 | Discharge: 2018-12-13 | Disposition: A | Discharge status: Home / Self Care | Payer: BC Managed Care – PPO | Source: Ambulatory Visit | Attending: Family Medicine | Admitting: Family Medicine

## 2018-12-13 ENCOUNTER — Encounter: Payer: Self-pay | Admitting: Family Medicine

## 2018-12-13 ENCOUNTER — Ambulatory Visit: Payer: BC Managed Care – PPO | Admitting: Family Medicine

## 2018-12-13 VITALS — BP 120/68 | HR 77 | Temp 98.4°F | Ht 66.0 in | Wt 149.2 lb

## 2018-12-13 DIAGNOSIS — R300 Dysuria: Secondary | ICD-10-CM | POA: Insufficient documentation

## 2018-12-13 DIAGNOSIS — N410 Acute prostatitis: Secondary | ICD-10-CM | POA: Diagnosis not present

## 2018-12-13 DIAGNOSIS — Z0001 Encounter for general adult medical examination with abnormal findings: Secondary | ICD-10-CM | POA: Diagnosis not present

## 2018-12-13 LAB — COMPREHENSIVE METABOLIC PANEL
ALBUMIN: 5.1 g/dL (ref 3.5–5.2)
ALK PHOS: 60 U/L (ref 39–117)
ALT: 12 U/L (ref 0–53)
AST: 22 U/L (ref 0–37)
BILIRUBIN TOTAL: 1 mg/dL (ref 0.2–1.2)
BUN: 14 mg/dL (ref 6–23)
CALCIUM: 9.8 mg/dL (ref 8.4–10.5)
CO2: 29 mEq/L (ref 19–32)
Chloride: 103 mEq/L (ref 96–112)
Creatinine, Ser: 0.86 mg/dL (ref 0.40–1.50)
GFR: 104.37 mL/min (ref >60.00)
GLUCOSE: 100 mg/dL — AB (ref 70–99)
POTASSIUM: 3.9 meq/L (ref 3.5–5.1)
Sodium: 139 mEq/L (ref 135–145)
TOTAL PROTEIN: 7.6 g/dL (ref 6.0–8.3)

## 2018-12-13 LAB — POCT URINALYSIS DIPSTICK
Bilirubin, UA: NEGATIVE
Blood, UA: NEGATIVE
GLUCOSE UA: NEGATIVE
Ketones, UA: NEGATIVE
Nitrite, UA: NEGATIVE
Protein, UA: NEGATIVE
SPEC GRAV UA: 1.025 (ref 1.010–1.025)
Urobilinogen, UA: 0.2 E.U./dL
pH, UA: 6 (ref 5.0–8.0)

## 2018-12-13 LAB — CBC
HEMATOCRIT: 44.5 % (ref 39.0–52.0)
HEMOGLOBIN: 15.2 g/dL (ref 13.0–17.0)
MCHC: 34.1 g/dL (ref 30.0–36.0)
MCV: 89 fl (ref 78.0–100.0)
Platelets: 205 10*3/uL (ref 150.0–400.0)
RBC: 5 Mil/uL (ref 4.22–5.81)
RDW: 13.8 % (ref 11.5–15.5)
WBC: 5.6 10*3/uL (ref 4.0–10.5)

## 2018-12-13 LAB — URINALYSIS, ROUTINE W REFLEX MICROSCOPIC
Bilirubin Urine: NEGATIVE
HGB URINE DIPSTICK: NEGATIVE
KETONES UR: NEGATIVE
LEUKOCYTE UA: NEGATIVE
Nitrite: NEGATIVE
PH: 6 (ref 5.0–8.0)
Specific Gravity, Urine: 1.025 (ref 1.000–1.030)
Total Protein, Urine: NEGATIVE
Urine Glucose: NEGATIVE
Urobilinogen, UA: 0.2 (ref 0.0–1.0)

## 2018-12-13 LAB — LIPID PANEL
Cholesterol: 168 mg/dL (ref 0–200)
HDL: 43.8 mg/dL (ref >39.00)
LDL Cholesterol: 113 mg/dL — ABNORMAL HIGH (ref 0–99)
NONHDL: 124.09
TRIGLYCERIDES: 56 mg/dL (ref 0.0–149.0)
Total CHOL/HDL Ratio: 4
VLDL: 11.2 mg/dL (ref 0.0–40.0)

## 2018-12-13 MED ORDER — DOXYCYCLINE HYCLATE 100 MG PO TABS: 100.0000 mg | ORAL_TABLET | Freq: Two times a day (BID) | ORAL | 0 refills | Status: DC

## 2018-12-13 NOTE — Progress Notes (Signed)
Established Patient Office Visit  Subjective:  Patient ID: Paul Rush, male    DOB: Mar 18, 1991  Age: 28 y.o. MRN: [id number]  CC:  Chief Complaint  Patient presents with  . Establish Care    HPI Paul Rush presents for for a complete physical and treatment for a week ho mild dysuria, nocutria, and frequency. Denies discharge. Sex with male partners. Fasting. Parents in their early 30s and in good health   Past Medical History:  Diagnosis Date  . Asthma     Past Surgical History:  Procedure Laterality Date  . CHOLECYSTECTOMY    . WISDOM TOOTH EXTRACTION    . WISDOM TOOTH EXTRACTION      Family History  Problem Relation Age of Onset  . Healthy Mother     Social History   Socioeconomic History  . Marital status: Single    Spouse name: Not on file  . Number of children: Not on file  . Years of education: Not on file  . Highest education level: Not on file  Occupational History  . Not on file  Social Needs  . Financial resource strain: Not on file  . Food insecurity:    Worry: Not on file    Inability: Not on file  . Transportation needs:    Medical: Not on file    Non-medical: Not on file  Tobacco Use  . Smoking status: Never Smoker  . Smokeless tobacco: Never Used  Substance and Sexual Activity  . Alcohol use: Yes    Alcohol/week: 2.0 standard drinks    Types: 2 Standard drinks or equivalent per week  . Drug use: No  . Sexual activity: Yes    Partners: Male  Lifestyle  . Physical activity:    Days per week: Not on file    Minutes per session: Not on file  . Stress: Not on file  Relationships  . Social connections:    Talks on phone: Not on file    Gets together: Not on file    Attends religious service: Not on file    Active member of club or organization: Not on file    Attends meetings of clubs or organizations: Not on file    Relationship status: Not on file  . Intimate partner violence:    Fear of current or ex partner: Not  on file    Emotionally abused: Not on file    Physically abused: Not on file    Forced sexual activity: Not on file  Other Topics Concern  . Not on file  Social History Narrative   Drinks 1-2 cups of caffeine daily.    Outpatient Medications Prior to Visit  Medication Sig Dispense Refill  . clonazePAM (KLONOPIN) 0.5 MG tablet Take 1 tablet (0.5 mg total) by mouth at bedtime. (Patient not taking: Reported on 04/06/2018) 30 tablet 0  . gabapentin (NEURONTIN) 300 MG capsule Take 2 capsules (600 mg total) by mouth 3 (three) times daily. (Patient not taking: Reported on 04/06/2018) 180 capsule 6  . terbinafine (LAMISIL) 250 MG tablet Take 1 tablet (250 mg total) by mouth daily. (Patient not taking: Reported on 04/06/2018) 90 tablet 0   No facility-administered medications prior to visit.     No Known Allergies  ROS Review of Systems  Constitutional: Negative.  Negative for chills, diaphoresis, fatigue, fever and unexpected weight change.  HENT: Negative.   Eyes: Negative for photophobia and visual disturbance.  Respiratory: Negative.   Cardiovascular: Negative.   Gastrointestinal:  Negative.   Endocrine: Negative for polyphagia and polyuria.  Genitourinary: Positive for dysuria and frequency. Negative for decreased urine volume, difficulty urinating, discharge, flank pain, genital sores, penile pain and urgency.  Musculoskeletal: Negative for joint swelling and myalgias.  Skin: Negative for pallor and rash.  Allergic/Immunologic: Negative for immunocompromised state.  Neurological: Negative for weakness, light-headedness and numbness.  Hematological: Does not bruise/bleed easily.  Psychiatric/Behavioral: Negative.       Objective:    Physical Exam  Constitutional: He is oriented to person, place, and time. He appears well-developed and well-nourished. No distress.  HENT:  Head: Normocephalic and atraumatic.  Right Ear: External ear normal.  Left Ear: External ear normal.   Mouth/Throat: Oropharynx is clear and moist. No oropharyngeal exudate.  Eyes: Pupils are equal, round, and reactive to light. Conjunctivae are normal. Right eye exhibits no discharge. Left eye exhibits no discharge. No scleral icterus.  Neck: Neck supple. No JVD present. No tracheal deviation present. No thyromegaly present.  Cardiovascular: Normal rate, regular rhythm and normal heart sounds.  Pulmonary/Chest: Effort normal and breath sounds normal. No stridor.  Abdominal: Soft. He exhibits no distension. There is no abdominal tenderness. There is no rebound and no guarding. Hernia confirmed negative in the right inguinal area and confirmed negative in the left inguinal area.    Genitourinary:    Penis normal.  Rectum:     Guaiac result negative.     No rectal mass, anal fissure, tenderness, external hemorrhoid, internal hemorrhoid or abnormal anal tone.  Prostate is not enlarged and not tender. Right testis shows no mass, no swelling and no tenderness. Right testis is descended. Left testis shows no mass, no swelling and no tenderness. Left testis is descended. Circumcised. No hypospadias, penile erythema or penile tenderness. No discharge found.  Musculoskeletal:        General: No edema.  Lymphadenopathy:    He has no cervical adenopathy.       Right: No inguinal adenopathy present.       Left: No inguinal adenopathy present.  Neurological: He is alert and oriented to person, place, and time.  Skin: Skin is warm and dry. He is not diaphoretic.  Psychiatric: He has a normal mood and affect. His behavior is normal.    BP 120/68   Pulse 77   Temp 98.4 F (36.9 C) (Oral)   Ht  (1.676 m)   Wt 149 lb 4 oz (67.7 kg)   SpO2 98%   BMI 24.09 kg/m  Wt Readings from Last 3 Encounters:  12/13/18 149 lb 4 oz (67.7 kg)  04/06/18 140 lb (63.5 kg)  04/09/16 148 lb 12.8 oz (67.5 kg)   BP Readings from Last 3 Encounters:  12/13/18 120/68  04/06/18 109/74  04/09/16 (!) 108/59    Guideline developer:  UpToDate (see UpToDate for funding source) Date Released: June 2014  Health Maintenance Due  Topic Date Due  . HIV Screening  11/19/2003  . TETANUS/TDAP  11/19/2007  . INFLUENZA VACCINE  04/29/2018    There are no preventive care reminders to display for this patient.  No results found for: TSH Lab Results  Component Value Date   WBC 13.6 (H) 04/06/2018   HGB 14.2 04/06/2018   HCT 42.5 04/06/2018   MCV 88.9 04/06/2018   PLT 228 04/06/2018   Lab Results  Component Value Date   NA 139 04/06/2018   K 3.4 (L) 04/06/2018   CO2 23 04/06/2018   GLUCOSE 84 04/06/2018  BUN 11 04/06/2018   CREATININE 0.93 04/06/2018   BILITOT 1.2 03/19/2016   ALKPHOS 82 03/19/2016   AST 17 03/19/2016   ALT 12 03/19/2016   PROT 7.8 03/19/2016   ALBUMIN 5.0 03/19/2016   CALCIUM 9.6 04/06/2018   ANIONGAP 14 04/06/2018   No results found for: CHOL No results found for: HDL No results found for: LDLCALC No results found for: TRIG No results found for: CHOLHDL No results found for: ZYYQ8G    Assessment & Plan:   Problem List Items Addressed This Visit    None    Visit Diagnoses    Dysuria    -  Primary   Relevant Medications   doxycycline (VIBRA-TABS) 100 MG tablet   Other Relevant Orders   POCT urinalysis dipstick (Completed)   CBC   Urinalysis, Routine w reflex microscopic   Urine Culture   Urine cytology ancillary only   Encounter for health maintenance examination with abnormal findings       Relevant Orders   CBC   Comprehensive metabolic panel   Lipid panel   HIV Antibody (routine testing w rflx)   Urinalysis, Routine w reflex microscopic   Acute prostatitis       Relevant Medications   doxycycline (VIBRA-TABS) 100 MG tablet   Other Relevant Orders   CBC   Urinalysis, Routine w reflex microscopic   Urine Culture      Meds ordered this encounter  Medications  . doxycycline (VIBRA-TABS) 100 MG tablet    Sig: Take 1 tablet (100 mg total) by  mouth 2 (two) times daily.    Dispense:  20 tablet    Refill:  0    Follow-up: Return in about 2 weeks (around 12/27/2018), or if symptoms worsen or fail to improve.

## 2018-12-13 NOTE — Patient Instructions (Addendum)
Health Maintenance, Male A healthy lifestyle and preventive care is important for your health and wellness. Ask your health care provider about what schedule of regular examinations is right for you. What should I know about weight and diet? Eat a Healthy Diet  Eat plenty of vegetables, fruits, whole grains, low-fat dairy products, and lean protein.  Do not eat a lot of foods high in solid fats, added sugars, or salt.  Maintain a Healthy Weight Regular exercise can help you achieve or maintain a healthy weight. You should:  Do at least 150 minutes of exercise each week. The exercise should increase your heart rate and make you sweat (moderate-intensity exercise).  Do strength-training exercises at least twice a week. Watch Your Levels of Cholesterol and Blood Lipids  Have your blood tested for lipids and cholesterol every 5 years starting at 30 years of age. If you are at high risk for heart disease, you should start having your blood tested when you are 30 years old. You may need to have your cholesterol levels checked more often if: ? Your lipid or cholesterol levels are high. ? You are older than 30 years of age. ? You are at high risk for heart disease. What should I know about cancer screening? Many types of cancers can be detected early and may often be prevented. Lung Cancer  You should be screened every year for lung cancer if: ? You are a current smoker who has smoked for at least 30 years. ? You are a former smoker who has quit within the past 15 years.  Talk to your health care provider about your screening options, when you should start screening, and how often you should be screened. Colorectal Cancer  Routine colorectal cancer screening usually begins at 30 years of age and should be repeated every 5-10 years until you are 30 years old. You may need to be screened more often if early forms of precancerous polyps or small growths are found. Your health care provider may  recommend screening at an earlier age if you have risk factors for colon cancer.  Your health care provider may recommend using home test kits to check for hidden blood in the stool.  A small camera at the end of a tube can be used to examine your colon (sigmoidoscopy or colonoscopy). This checks for the earliest forms of colorectal cancer. Prostate and Testicular Cancer  Depending on your age and overall health, your health care provider may do certain tests to screen for prostate and testicular cancer.  Talk to your health care provider about any symptoms or concerns you have about testicular or prostate cancer. Skin Cancer  Check your skin from head to toe regularly.  Tell your health care provider about any new moles or changes in moles, especially if: ? There is a change in a mole's size, shape, or color. ? You have a mole that is larger than a pencil eraser.  Always use sunscreen. Apply sunscreen liberally and repeat throughout the day.  Protect yourself by wearing long sleeves, pants, a wide-brimmed hat, and sunglasses when outside. What should I know about heart disease, diabetes, and high blood pressure?  If you are 7-65 years of age, have your blood pressure checked every 3-5 years. If you are 63 years of age or older, have your blood pressure checked every year. You should have your blood pressure measured twice-once when you are at a hospital or clinic, and once when you are not at a hospital  or clinic. Record the average of the two measurements. To check your blood pressure when you are not at a hospital or clinic, you can use: ? An automated blood pressure machine at a pharmacy. ? A home blood pressure monitor.  Talk to your health care provider about your target blood pressure.  If you are between 60-41 years old, ask your health care provider if you should take aspirin to prevent heart disease.  Have regular diabetes screenings by checking your fasting blood sugar  level. ? If you are at a normal weight and have a low risk for diabetes, have this test once every three years after the age of 16. ? If you are overweight and have a high risk for diabetes, consider being tested at a younger age or more often.  A one-time screening for abdominal aortic aneurysm (AAA) by ultrasound is recommended for men aged 58-75 years who are current or former smokers. What should I know about preventing infection? Hepatitis B If you have a higher risk for hepatitis B, you should be screened for this virus. Talk with your health care provider to find out if you are at risk for hepatitis B infection. Hepatitis C Blood testing is recommended for:  Everyone born from [birth year] through 1967.  Anyone with known risk factors for hepatitis C. Sexually Transmitted Diseases (STDs)  You should be screened each year for STDs including gonorrhea and chlamydia if: ? You are sexually active and are younger than 30 years of age. ? You are older than 30 years of age and your health care provider tells you that you are at risk for this type of infection. ? Your sexual activity has changed since you were last screened and you are at an increased risk for chlamydia or gonorrhea. Ask your health care provider if you are at risk.  Talk with your health care provider about whether you are at high risk of being infected with HIV. Your health care provider may recommend a prescription medicine to help prevent HIV infection. What else can I do?  Schedule regular health, dental, and eye exams.  Stay current with your vaccines (immunizations).  Do not use any tobacco products, such as cigarettes, chewing tobacco, and e-cigarettes. If you need help quitting, ask your health care provider.  Limit alcohol intake to no more than 2 drinks per day. One drink equals 12 ounces of beer, 5 ounces of wine, or 1 ounces of hard liquor.  Do not use street drugs.  Do not share needles.  Ask your health  care provider for help if you need support or information about quitting drugs.  Tell your health care provider if you often feel depressed.  Tell your health care provider if you have ever been abused or do not feel safe at home. This information is not intended to replace advice given to you by your health care provider. Make sure you discuss any questions you have with your health care provider. Document Released: 03/13/2008 Document Revised: 05/14/2016 Document Reviewed: 06/19/2015 Elsevier Interactive Patient Education  2019 [employer] 18-39 Years, Male Preventive care refers to lifestyle choices and visits with your health care provider that can promote health and wellness. What does preventive care include?   A yearly physical exam. This is also called an annual well check.  Dental exams once or twice a year.  Routine eye exams. Ask your health care provider how often you should have your eyes checked.  Personal lifestyle choices,  including: ? Daily care of your teeth and gums. ? Regular physical activity. ? Eating a healthy diet. ? Avoiding tobacco and drug use. ? Limiting alcohol use. ? Practicing safe sex. What happens during an annual well check? The services and screenings done by your health care provider during your annual well check will depend on your age, overall health, lifestyle risk factors, and family history of disease. Counseling Your health care provider may ask you questions about your:  Alcohol use.  Tobacco use.  Drug use.  Emotional well-being.  Home and relationship well-being.  Sexual activity.  Eating habits.  Work and work Statistician. Screening You may have the following tests or measurements:  Height, weight, and BMI.  Blood pressure.  Lipid and cholesterol levels. These may be checked every 5 years starting at age 37.  Diabetes screening. This is done by checking your blood sugar (glucose) after you have  not eaten for a while (fasting).  Skin check.  Hepatitis C blood test.  Hepatitis B blood test.  Sexually transmitted disease (STD) testing. Discuss your test results, treatment options, and if necessary, the need for more tests with your health care provider. Vaccines Your health care provider may recommend certain vaccines, such as:  Influenza vaccine. This is recommended every year.  Tetanus, diphtheria, and acellular pertussis (Tdap, Td) vaccine. You may need a Td booster every 10 years.  Varicella vaccine. You may need this if you have not been vaccinated.  HPV vaccine. If you are 43 or younger, you may need three doses over 6 months.  Measles, mumps, and rubella (MMR) vaccine. You may need at least one dose of MMR.You may also need a second dose.  Pneumococcal 13-valent conjugate (PCV13) vaccine. You may need this if you have certain conditions and have not been vaccinated.  Pneumococcal polysaccharide (PPSV23) vaccine. You may need one or two doses if you smoke cigarettes or if you have certain conditions.  Meningococcal vaccine. One dose is recommended if you are age 31-21 years and a first-year college student living in a residence hall, or if you have one of several medical conditions. You may also need additional booster doses.  Hepatitis A vaccine. You may need this if you have certain conditions or if you travel or work in places where you may be exposed to hepatitis A.  Hepatitis B vaccine. You may need this if you have certain conditions or if you travel or work in places where you may be exposed to hepatitis B.  Haemophilus influenzae type b (Hib) vaccine. You may need this if you have certain risk factors. Talk to your health care provider about which screenings and vaccines you need and how often you need them. This information is not intended to replace advice given to you by your health care provider. Make sure you discuss any questions you have with your  health care provider. Document Released: 11/11/2001 Document Revised: 04/28/2017 Document Reviewed: 07/17/2015 Elsevier Interactive Patient Education  2019 [employer] Inc.  Prostatitis  Prostatitis is swelling of the prostate gland. The prostate helps to make semen. It is below a man's bladder, in front of the rectum. There are different types of prostatitis. Follow these instructions at home:   Take over-the-counter and prescription medicines only as told by your doctor.  If you were prescribed an antibiotic medicine, take it as told by your doctor. Do not stop taking the antibiotic even if you start to feel better.  If your doctor prescribed exercises, do them as  directed.  Take sitz baths as told by your doctor. To take a sitz bath, sit in warm water that is deep enough to cover your hips and butt.  Keep all follow-up visits as told by your doctor. This is important. Contact a doctor if:  Your symptoms get worse.  You have a fever. Get help right away if:  You have chills.  You feel sick to your stomach (nauseous).  You throw up (vomit).  You feel light-headed.  You feel like you might pass out (faint).  You cannot pee (urinate).  You have blood or clumps of blood (blood clots) in your pee (urine). This information is not intended to replace advice given to you by your health care provider. Make sure you discuss any questions you have with your health care provider. Document Released: 03/16/2012 Document Revised: 06/05/2016 Document Reviewed: 06/05/2016 Elsevier Interactive Patient Education  2019 [employer]. Doxycycline tablets or capsules What is this medicine? DOXYCYCLINE (dox i SYE kleen) is a tetracycline antibiotic. It kills certain bacteria or stops their growth. It is used to treat many kinds of infections, like dental, skin, respiratory, and urinary tract infections. It also treats acne, Lyme disease, malaria, and certain sexually transmitted infections.  This medicine may be used for other purposes; ask your health care provider or pharmacist if you have questions. COMMON BRAND NAME(S): Acticlate, Adoxa, Adoxa CK, Adoxa Pak, Adoxa TT, Alodox, Avidoxy, Doxal, LYMEPAK, Mondoxyne NL, Monodox, Morgidox 1x, Morgidox 1x Kit, Morgidox 2x, Morgidox 2x Kit, NutriDox, Ocudox, TARGADOX, Vibra-Tabs, Vibramycin What should I tell my health care provider before I take this medicine? They need to know if you have any of these conditions: -liver disease -long exposure to sunlight like working outdoors -stomach problems like colitis -an unusual or allergic reaction to doxycycline, tetracycline antibiotics, other medicines, foods, dyes, or preservatives -pregnant or trying to get pregnant -breast-feeding How should I use this medicine? Take this medicine by mouth with a full glass of water. Follow the directions on the prescription label. It is best to take this medicine without food, but if it upsets your stomach take it with food. Take your medicine at regular intervals. Do not take your medicine more often than directed. Take all of your medicine as directed even if you think you are better. Do not skip doses or stop your medicine early. Talk to your pediatrician regarding the use of this medicine in children. While this drug may be prescribed for selected conditions, precautions do apply. Overdosage: If you think you have taken too much of this medicine contact a poison control center or emergency room at once. NOTE: This medicine is only for you. Do not share this medicine with others. What if I miss a dose? If you miss a dose, take it as soon as you can. If it is almost time for your next dose, take only that dose. Do not take double or extra doses. What may interact with this medicine? -antacids -barbiturates -birth control pills -bismuth subsalicylate -carbamazepine -methoxyflurane -other antibiotics -phenytoin -vitamins that contain iron -warfarin  This list may not describe all possible interactions. Give your health care provider a list of all the medicines, herbs, non-prescription drugs, or dietary supplements you use. Also tell them if you smoke, drink alcohol, or use illegal drugs. Some items may interact with your medicine. What should I watch for while using this medicine? Tell your doctor or health care professional if your symptoms do not improve. Do not treat diarrhea with over the  counter products. Contact your doctor if you have diarrhea that lasts more than 2 days or if it is severe and watery. Do not take this medicine just before going to bed. It may not dissolve properly when you lay down and can cause pain in your throat. Drink plenty of fluids while taking this medicine to also help reduce irritation in your throat. This medicine can make you more sensitive to the sun. Keep out of the sun. If you cannot avoid being in the sun, wear protective clothing and use sunscreen. Do not use sun lamps or tanning beds/booths. Birth control pills may not work properly while you are taking this medicine. Talk to your doctor about using an extra method of birth control. If you are being treated for a sexually transmitted infection, avoid sexual contact until you have finished your treatment. Your sexual partner may also need treatment. Avoid antacids, aluminum, calcium, magnesium, and iron products for 4 hours before and 2 hours after taking a dose of this medicine. If you are using this medicine to prevent malaria, you should still protect yourself from contact with mosquitos. Stay in screened-in areas, use mosquito nets, keep your body covered, and use an insect repellent. What side effects may I notice from receiving this medicine? Side effects that you should report to your doctor or health care professional as soon as possible: -allergic reactions like skin rash, itching or hives, swelling of the face, lips, or tongue -difficulty  breathing -fever -itching in the rectal or genital area -pain on swallowing -redness, blistering, peeling or loosening of the skin, including inside the mouth -severe stomach pain or cramps -unusual bleeding or bruising -unusually weak or tired -yellowing of the eyes or skin Side effects that usually do not require medical attention (report to your doctor or health care professional if they continue or are bothersome): -diarrhea -loss of appetite -nausea, vomiting This list may not describe all possible side effects. Call your doctor for medical advice about side effects. You may report side effects to FDA at 1-800-FDA-1088. Where should I keep my medicine? Keep out of the reach of children. Store at room temperature, below 30 degrees C (86 degrees F). Protect from light. Keep container tightly closed. Throw away any unused medicine after the expiration date. Taking this medicine after the expiration date can make you seriously ill. NOTE: This sheet is a summary. It may not cover all possible information. If you have questions about this medicine, talk to your doctor, pharmacist, or health care provider.  2019 Elsevier/Gold Standard (2015-10-17 17:11:22)

## 2018-12-14 LAB — URINE CULTURE
MICRO NUMBER: 323319
Result:: NO GROWTH
SPECIMEN QUALITY:: ADEQUATE

## 2018-12-14 LAB — URINE CYTOLOGY ANCILLARY ONLY
Chlamydia: NEGATIVE
Neisseria Gonorrhea: NEGATIVE

## 2018-12-14 LAB — HIV ANTIBODY (ROUTINE TESTING W REFLEX): HIV: NONREACTIVE

## 2018-12-16 ENCOUNTER — Telehealth: Payer: Self-pay

## 2018-12-16 NOTE — Telephone Encounter (Signed)
Labs were essentially normal. Urine culture was negative and there  Was no evidence of sexually transmitted disease.

## 2018-12-16 NOTE — Telephone Encounter (Signed)
Copied from CRM [id number]. Topic: General - Other >> Dec 15, 2018  4:50 PM Marylen Ponto wrote: Reason for CRM: Pt called requesting lab results. Pt requests call back.

## 2018-12-16 NOTE — Telephone Encounter (Signed)
I called and spoke with patient, we went over the results below. Advised patient to give the antibiotic time to work and to let us know if he is not having any relief even after completing the antibiotic. Patient verbalized understanding.

## 2019-02-25 ENCOUNTER — Telehealth (INDEPENDENT_AMBULATORY_CARE_PROVIDER_SITE_OTHER): Payer: BC Managed Care – PPO | Admitting: Family Medicine

## 2019-02-25 ENCOUNTER — Encounter: Payer: Self-pay | Admitting: Family Medicine

## 2019-02-25 ENCOUNTER — Telehealth: Payer: Self-pay | Admitting: Behavioral Health

## 2019-02-25 DIAGNOSIS — R350 Frequency of micturition: Secondary | ICD-10-CM | POA: Diagnosis not present

## 2019-02-25 DIAGNOSIS — N3281 Overactive bladder: Secondary | ICD-10-CM

## 2019-02-25 MED ORDER — TOLTERODINE TARTRATE ER 4 MG PO CP24: 4.0000 mg | ORAL_CAPSULE | Freq: Every day | ORAL | 1 refills | Status: DC

## 2019-02-25 NOTE — Progress Notes (Signed)
Paul Rush he is currently seeing a urologist okay although trying virtual Visit via Video Note  I connected with Paul Rush on 02/25/19 at 10:00 AM EDT by a video enabled telemedicine application and verified that I am speaking with the correct person using two identifiers.  Location: Patient: home Provider:    I discussed the limitations of evaluation and management by telemedicine and the availability of in person appointments. The patient expressed understanding and agreed to proceed.  History of Present Illness:    Observations/Objective:   Assessment and Plan:   Established Patient Office Visit  Subjective:  Patient ID: Paul Rush, male    DOB: 02/19/1991  Age: 28 y.o. MRN: [id number]  CC:  Chief Complaint  Patient presents with  . bladder problems    HPI Paul Rush presents for follow-up, treatment and evaluation of urinary frequency.  This episode started approximately a week ago.  He experiences nocturia x3 this past evening.  He had a small amount of fluid for.  Patient denies fevers chills, nausea vomiting, decrease in the force of his stream, pre-or post void dribbling, dysuria, incomplete emptying, or discharge.  Work-up in the past has been negative.  He male sexual partner has been asymptomatic.  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  Things have been stressful.  Claims adequate fluid intake.  Prostate exam was normal 2 months ago.  Past Medical History:  Diagnosis Date  . Asthma     Past Surgical History:  Procedure Laterality Date  . CHOLECYSTECTOMY    . WISDOM TOOTH EXTRACTION    . WISDOM TOOTH EXTRACTION      Family History  Problem Relation Age of Onset  . Healthy Mother     Social History   Socioeconomic History  . Marital status: Single    Spouse name: Not on file  . Number of children: Not on file  . Years of education: Not on file  . Highest education level: Not on file   Occupational History  . Not on file  Social Needs  . Financial resource strain: Not on file  . Food insecurity:    Worry: Not on file    Inability: Not on file  . Transportation needs:    Medical: Not on file    Non-medical: Not on file  Tobacco Use  . Smoking status: Never Smoker  . Smokeless tobacco: Never Used  Substance and Sexual Activity  . Alcohol use: Yes    Alcohol/week: 2.0 standard drinks    Types: 2 Standard drinks or equivalent per week  . Drug use: No  . Sexual activity: Yes    Partners: Male  Lifestyle  . Physical activity:    Days per week: Not on file    Minutes per session: Not on file  . Stress: Not on file  Relationships  . Social connections:    Talks on phone: Not on file    Gets together: Not on file    Attends religious service: Not on file    Active member of club or organization: Not on file    Attends meetings of clubs or organizations: Not on file    Relationship status: Not on file  . Intimate partner violence:    Fear of current or ex partner: Not on file    Emotionally abused: Not on file    Physically abused: Not on file    Forced sexual activity: Not on file  Other Topics Concern  . Not on file  Social History Narrative   Drinks 1-2 cups of caffeine daily.    Outpatient Medications Prior to Visit  Medication Sig Dispense Refill  . doxycycline (VIBRA-TABS) 100 MG tablet Take 1 tablet (100 mg total) by mouth 2 (two) times daily. 20 tablet 0   No facility-administered medications prior to visit.     No Known Allergies  ROS Review of Systems  Constitutional: Negative for diaphoresis, fever and unexpected weight change.  Respiratory: Negative.   Cardiovascular: Negative.   Gastrointestinal: Negative.   Genitourinary: Positive for frequency and urgency. Negative for decreased urine volume, difficulty urinating, discharge, dysuria, flank pain, hematuria and penile pain.      Objective:    Physical Exam  There were no  vitals taken for this visit. Wt Readings from Last 3 Encounters:  12/13/18 149 lb 4 oz (67.7 kg)  04/06/18 140 lb (63.5 kg)  04/09/16 148 lb 12.8 oz (67.5 kg)     Health Maintenance Due  Topic Date Due  . TETANUS/TDAP  11/19/2007    There are no preventive care reminders to display for this patient.  No results found for: TSH Lab Results  Component Value Date   WBC 5.6 12/13/2018   HGB 15.2 12/13/2018   HCT 44.5 12/13/2018   MCV 89.0 12/13/2018   PLT 205.0 12/13/2018   Lab Results  Component Value Date   NA 139 12/13/2018   K 3.9 12/13/2018   CO2 29 12/13/2018   GLUCOSE 100 (H) 12/13/2018   BUN 14 12/13/2018   CREATININE 0.86 12/13/2018   BILITOT 1.0 12/13/2018   ALKPHOS 60 12/13/2018   AST 22 12/13/2018   ALT 12 12/13/2018   PROT 7.6 12/13/2018   ALBUMIN 5.1 12/13/2018   CALCIUM 9.8 12/13/2018   ANIONGAP 14 04/06/2018   GFR 104.37 12/13/2018   Lab Results  Component Value Date   CHOL 168 12/13/2018   Lab Results  Component Value Date   HDL 43.80 12/13/2018   Lab Results  Component Value Date   LDLCALC 113 (H) 12/13/2018   Lab Results  Component Value Date   TRIG 56.0 12/13/2018   Lab Results  Component Value Date   CHOLHDL 4 12/13/2018   No results found for: HGBA1C    Assessment & Plan:   Problem List Items Addressed This Visit    None    Visit Diagnoses    OAB (overactive bladder)    -  Primary   Relevant Medications   tolterodine (DETROL LA) 4 MG 24 hr capsule   Urinary frequency       Relevant Orders   Urinalysis, Routine w reflex microscopic   Urine Culture   Urine cytology ancillary only      Meds ordered this encounter  Medications  . tolterodine (DETROL LA) 4 MG 24 hr capsule    Sig: Take 1 capsule (4 mg total) by mouth daily for 30 days.    Dispense:  30 capsule    Refill:  1    Follow-up: Return in about 3 weeks (around 03/18/2019).    Mliss SaxWilliam Alfred Maria Coin, MD  Follow Up Instructions:    I discussed the  assessment and treatment plan with the patient. The patient was provided an opportunity to ask questions and all were answered. The patient agreed with the plan and demonstrated an understanding of the instructions.   The patient was advised to call back or seek an in-person evaluation if the symptoms worsen  or if the condition fails to improve as anticipated.  I provided 23 minutes of non-face-to-face time during this encounter.

## 2019-02-25 NOTE — Telephone Encounter (Signed)
Questions for Screening COVID-19   Travel or Contacts: None  During this illness, did/does the patient experience any of the following symptoms? Fever >100.103F []   Yes [x]   No []   Unknown Subjective fever (felt feverish) []   Yes [x]   No []   Unknown Chills []   Yes [x]   No []   Unknown Muscle aches (myalgia) []   Yes [x]   No []   Unknown Runny nose (rhinorrhea) []   Yes [x]   No []   Unknown Sore throat []   Yes [x]   No []   Unknown Cough (new onset or worsening of chronic cough) []   Yes [x]   No []   Unknown Shortness of breath (dyspnea) []   Yes [x]   No []   Unknown Nausea or vomiting []   Yes [x]   No []   Unknown Headache []   Yes [x]   No []   Unknown Abdominal pain  []   Yes [x]   No []   Unknown Diarrhea (?3 loose/looser than normal stools/24hr period) []   Yes [x]   No []   Unknown Other, specify: N/A

## 2019-02-28 ENCOUNTER — Other Ambulatory Visit (INDEPENDENT_AMBULATORY_CARE_PROVIDER_SITE_OTHER): Payer: BC Managed Care – PPO

## 2019-02-28 ENCOUNTER — Other Ambulatory Visit (HOSPITAL_COMMUNITY)
Admission: RE | Admit: 2019-02-28 | Discharge: 2019-02-28 | Disposition: A | Discharge status: Home / Self Care | Payer: BC Managed Care – PPO | Source: Ambulatory Visit | Attending: Family Medicine | Admitting: Family Medicine

## 2019-02-28 DIAGNOSIS — R350 Frequency of micturition: Secondary | ICD-10-CM | POA: Diagnosis present

## 2019-02-28 LAB — URINALYSIS, ROUTINE W REFLEX MICROSCOPIC
Bilirubin Urine: NEGATIVE
Hgb urine dipstick: NEGATIVE
Ketones, ur: NEGATIVE
Leukocytes,Ua: NEGATIVE
Nitrite: NEGATIVE
RBC / HPF: NONE SEEN (ref 0–?)
Specific Gravity, Urine: 1.02 (ref 1.000–1.030)
Urine Glucose: NEGATIVE
Urobilinogen, UA: 0.2 (ref 0.0–1.0)
WBC, UA: NONE SEEN (ref 0–?)
pH: 6.5 (ref 5.0–8.0)

## 2019-03-01 LAB — URINE CULTURE
MICRO NUMBER:: 523869
Result:: NO GROWTH
SPECIMEN QUALITY:: ADEQUATE

## 2019-03-01 LAB — URINE CYTOLOGY ANCILLARY ONLY: Chlamydia: NEGATIVE

## 2019-03-03 LAB — URINE CYTOLOGY ANCILLARY ONLY: Candida vaginitis: NEGATIVE

## 2019-03-11 ENCOUNTER — Telehealth (INDEPENDENT_AMBULATORY_CARE_PROVIDER_SITE_OTHER): Payer: BC Managed Care – PPO | Admitting: Family Medicine

## 2019-03-11 ENCOUNTER — Encounter: Payer: Self-pay | Admitting: Family Medicine

## 2019-03-11 DIAGNOSIS — N3281 Overactive bladder: Secondary | ICD-10-CM | POA: Diagnosis not present

## 2019-03-11 MED ORDER — TOLTERODINE TARTRATE ER 4 MG PO CP24: 4.0000 mg | ORAL_CAPSULE | Freq: Every day | ORAL | 1 refills | Status: DC

## 2019-03-11 NOTE — Progress Notes (Addendum)
Established Patient Office Visit  Subjective:  Patient ID: Paul Rush, male    DOB: March 10, 1991  Age: 28 y.o. MRN: [id number]  CC:  Chief Complaint  Patient presents with  . Follow-up    HPI Paul BrownsStephen C Medlen presents for follow-up status post initiation of treatment for overactive bladder with the Detrol LA.  Symptoms are much improved.  Decrease in frequency and urgency.  Denies anticholinergic anergic side effects to include dry eyes mouth constipation abdominal pain.  Past Medical History:  Diagnosis Date  . Asthma     Past Surgical History:  Procedure Laterality Date  . CHOLECYSTECTOMY    . WISDOM TOOTH EXTRACTION    . WISDOM TOOTH EXTRACTION      Family History  Problem Relation Age of Onset  . Healthy Mother     Social History   Socioeconomic History  . Marital status: Single    Spouse name: Not on file  . Number of children: Not on file  . Years of education: Not on file  . Highest education level: Not on file  Occupational History  . Not on file  Social Needs  . Financial resource strain: Not on file  . Food insecurity    Worry: Not on file    Inability: Not on file  . Transportation needs    Medical: Not on file    Non-medical: Not on file  Tobacco Use  . Smoking status: Never Smoker  . Smokeless tobacco: Never Used  Substance and Sexual Activity  . Alcohol use: Yes    Alcohol/week: 2.0 standard drinks    Types: 2 Standard drinks or equivalent per week  . Drug use: No  . Sexual activity: Yes    Partners: Male  Lifestyle  . Physical activity    Days per week: Not on file    Minutes per session: Not on file  . Stress: Not on file  Relationships  . Social Musicianconnections    Talks on phone: Not on file    Gets together: Not on file    Attends religious service: Not on file    Active member of club or organization: Not on file    Attends meetings of clubs or organizations: Not on file    Relationship status: Not on file  . Intimate  partner violence    Fear of current or ex partner: Not on file    Emotionally abused: Not on file    Physically abused: Not on file    Forced sexual activity: Not on file  Other Topics Concern  . Not on file  Social History Narrative   Drinks 1-2 cups of caffeine daily.    Outpatient Medications Prior to Visit  Medication Sig Dispense Refill  . tolterodine (DETROL LA) 4 MG 24 hr capsule Take 1 capsule (4 mg total) by mouth daily for 30 days. 30 capsule 1   No facility-administered medications prior to visit.     No Known Allergies  ROS Review of Systems  Constitutional: Negative for diaphoresis, fatigue, fever and unexpected weight change.  Eyes: Negative for pain and itching.  Gastrointestinal: Negative for abdominal pain and constipation.  Genitourinary: Negative for difficulty urinating.      Objective:    Physical Exam  Constitutional: He is oriented to person, place, and time. He appears well-developed and well-nourished. No distress.  HENT:  Head: Normocephalic and atraumatic.  Right Ear: External ear normal.  Left Ear: External ear normal.  Eyes: Right eye exhibits no  discharge. Left eye exhibits no discharge. No scleral icterus.  Neck: No tracheal deviation present.  Pulmonary/Chest: Effort normal. No stridor.  Neurological: He is alert and oriented to person, place, and time.  Skin: Skin is warm and dry. He is not diaphoretic.  Psychiatric: He has a normal mood and affect. His behavior is normal.    There were no vitals taken for this visit. Wt Readings from Last 3 Encounters:  12/13/18 149 lb 4 oz (67.7 kg)  04/06/18 140 lb (63.5 kg)  04/09/16 148 lb 12.8 oz (67.5 kg)   BP Readings from Last 3 Encounters:  12/13/18 120/68  04/06/18 109/74  04/09/16 (!) 108/59   Guideline developer:  UpToDate (see UpToDate for funding source) Date Released: June 2014  Health Maintenance Due  Topic Date Due  . Samul Dada  11/19/2007    There are no preventive  care reminders to display for this patient.  No results found for: TSH Lab Results  Component Value Date   WBC 5.6 12/13/2018   HGB 15.2 12/13/2018   HCT 44.5 12/13/2018   MCV 89.0 12/13/2018   PLT 205.0 12/13/2018   Lab Results  Component Value Date   NA 139 12/13/2018   K 3.9 12/13/2018   CO2 29 12/13/2018   GLUCOSE 100 (H) 12/13/2018   BUN 14 12/13/2018   CREATININE 0.86 12/13/2018   BILITOT 1.0 12/13/2018   ALKPHOS 60 12/13/2018   AST 22 12/13/2018   ALT 12 12/13/2018   PROT 7.6 12/13/2018   ALBUMIN 5.1 12/13/2018   CALCIUM 9.8 12/13/2018   ANIONGAP 14 04/06/2018   GFR 104.37 12/13/2018   Lab Results  Component Value Date   CHOL 168 12/13/2018   Lab Results  Component Value Date   HDL 43.80 12/13/2018   Lab Results  Component Value Date   LDLCALC 113 (H) 12/13/2018   Lab Results  Component Value Date   TRIG 56.0 12/13/2018   Lab Results  Component Value Date   CHOLHDL 4 12/13/2018   No results found for: HGBA1C    Assessment & Plan:   Problem List Items Addressed This Visit    None    Visit Diagnoses    OAB (overactive bladder)    -  Primary   Relevant Medications   tolterodine (DETROL LA) 4 MG 24 hr capsule      Meds ordered this encounter  Medications  . tolterodine (DETROL LA) 4 MG 24 hr capsule    Sig: Take 1 capsule (4 mg total) by mouth daily for 30 days.    Dispense:  90 capsule    Refill:  1    Follow-up: Return in about 6 months (around 09/10/2019), or if symptoms worsen or fail to improve.   Virtual Visit via Video Note  I connected with Unknown Jim on 03/21/19 at  9:00 AM EDT by a video enabled telemedicine application and verified that I am speaking with the correct person using two identifiers.  Location: Patient: home Provider:    I discussed the limitations of evaluation and management by telemedicine and the availability of in person appointments. The patient expressed understanding and agreed to proceed.   History of Present Illness:    Observations/Objective:   Assessment and Plan:   Follow Up Instructions:    I discussed the assessment and treatment plan with the patient. The patient was provided an opportunity to ask questions and all were answered. The patient agreed with the plan and demonstrated an understanding of the  instructions.   The patient was advised to call back or seek an in-person evaluation if the symptoms worsen or if the condition fails to improve as anticipated.  I provided 10 minutes of non-face-to-face time during this encounter.   Mliss SaxWilliam Alfred Kremer, MD

## 2019-04-26 ENCOUNTER — Encounter: Payer: Self-pay | Admitting: Sports Medicine

## 2019-04-26 ENCOUNTER — Other Ambulatory Visit: Payer: Self-pay

## 2019-04-26 ENCOUNTER — Ambulatory Visit: Payer: BC Managed Care – PPO | Admitting: Sports Medicine

## 2019-04-26 VITALS — Temp 98.1°F

## 2019-04-26 DIAGNOSIS — Z79899 Other long term (current) drug therapy: Secondary | ICD-10-CM | POA: Diagnosis not present

## 2019-04-26 DIAGNOSIS — B351 Tinea unguium: Secondary | ICD-10-CM | POA: Diagnosis not present

## 2019-04-26 DIAGNOSIS — M79674 Pain in right toe(s): Secondary | ICD-10-CM

## 2019-04-26 MED ORDER — TERBINAFINE HCL 250 MG PO TABS: 250.0000 mg | ORAL_TABLET | Freq: Every day | ORAL | 0 refills | Status: DC

## 2019-04-26 NOTE — Progress Notes (Signed)
Subjective: Paul Rush is a 28 y.o. male patient seen today in office for follow evaluation of right nail fungus and fungal culture results from 2017, reports that he never completed the Lamisil and since has had issues with right 1-5 toenails growing out thick and discolored has tried many treatments including over-the-counter tea tree oil and Vicks VapoRub to his nails without any improvements. Patient has no other pedal complaints at this time.   Review of Systems  All other systems reviewed and are negative.    There are no active problems to display for this patient.   No current outpatient medications on file prior to visit.   No current facility-administered medications on file prior to visit.     No Known Allergies  Objective: Physical Exam  General: Well developed, nourished, no acute distress, awake, alert and oriented x 3  Vascular: Dorsalis pedis artery 2/4 bilateral, Posterior tibial artery 2/4 bilateral, skin temperature warm to warm proximal to distal bilateral lower extremities, no varicosities, pedal hair present bilateral. Mild increased moisture to both feet suggestive of hyperhydrosis.  Neurological: Gross sensation present via light touch bilateral.   Dermatological: Skin is warm, dry, and supple bilateral, Nails 1-10 are tender, short thick, and discolored with mild subungal debris R>L, no webspace macerations present bilateral, no open lesions present bilateral, no callus/corns/hyperkeratotic tissue present bilateral. No signs of infection bilateral.  Musculoskeletal: No symptomatic boney deformities noted bilateral. Muscular strength within normal limits without painon range of motion. No pain with calf compression bilateral.  Fungal culture + T Rubrum  Assessment and Plan:  Problem List Items Addressed This Visit    None    Visit Diagnoses    Nail fungus    -  Primary   Relevant Medications   terbinafine (LAMISIL) 250 MG tablet   Encounter for  long-term (current) use of high-risk medication       Toe pain, right         -Examined patient -Discussed treatment options for painful mycotic nails -Patient opt for oral Lamisil with full understanding of medication risks; LFTs normal; Rx  Sent to pharmacy for lamisil 250mg  PO daily. Anticipate 12 week course. -Advised good hygiene habits -Patient to return in 6 weeks for med check or sooner if symptoms worsen.  Landis Martins, DPM

## 2019-06-07 ENCOUNTER — Ambulatory Visit: Payer: BC Managed Care – PPO | Admitting: Sports Medicine

## 2019-06-07 ENCOUNTER — Other Ambulatory Visit: Payer: Self-pay

## 2019-06-07 ENCOUNTER — Encounter: Payer: Self-pay | Admitting: Sports Medicine

## 2019-06-07 DIAGNOSIS — M79674 Pain in right toe(s): Secondary | ICD-10-CM

## 2019-06-07 DIAGNOSIS — Z79899 Other long term (current) drug therapy: Secondary | ICD-10-CM | POA: Diagnosis not present

## 2019-06-07 DIAGNOSIS — B351 Tinea unguium: Secondary | ICD-10-CM | POA: Diagnosis not present

## 2019-06-07 NOTE — Progress Notes (Signed)
Subjective: Paul Rush is a 28 y.o. male patient seen today in office for follow up evaluation of nail fungus on Lamisil. Patient states that he/she is doing well with no adverse reaction. Patient has no other pedal complaints at this time.   There are no active problems to display for this patient.   Current Outpatient Medications on File Prior to Visit  Medication Sig Dispense Refill  . terbinafine (LAMISIL) 250 MG tablet Take 1 tablet (250 mg total) by mouth daily. 90 tablet 0   No current facility-administered medications on file prior to visit.     No Known Allergies  Objective: Physical Exam  General: Well developed, nourished, no acute distress, awake, alert and oriented x 3  Vascular: Dorsalis pedis artery 2/4 bilateral, Posterior tibial artery 2/4 bilateral, skin temperature warm to warm proximal to distal bilateral lower extremities, no varicosities, pedal hair present bilateral.  Neurological: Gross sensation present via light touch bilateral.   Dermatological: Skin is warm, dry, and supple bilateral, Nails 1-5 on right are tender, short thick, and discolored with mild subungal debris and early clearance noted at proximal nail bed, no webspace macerations present bilateral, no open lesions present bilateral, no callus/corns/hyperkeratotic tissue present bilateral. No signs of infection bilateral.  Musculoskeletal: No symptomatic boney deformities noted bilateral. Muscular strength within normal limits without painon range of motion. No pain with calf compression bilateral.  Assessment and Plan:  Problem List Items Addressed This Visit    None    Visit Diagnoses    Encounter for long-term (current) use of high-risk medication    -  Primary   Relevant Orders   Hepatic Function Panel   Nail fungus       Toe pain, right          -Examined patient -Cont with Lamisil; a new set of LFTs were ordered; will call patient to stop medication if abnormal  -Advised  good hygiene habits and educated patient on proper foot care to prevent re-infection -Patient to return in 6 weeks for med check follow up evaluation or sooner if symptoms worsen.  Landis Martins, DPM

## 2019-07-19 ENCOUNTER — Ambulatory Visit: Payer: BC Managed Care – PPO | Admitting: Sports Medicine

## 2019-08-02 ENCOUNTER — Ambulatory Visit: Payer: BC Managed Care – PPO | Admitting: Sports Medicine

## 2019-09-27 ENCOUNTER — Other Ambulatory Visit: Payer: Self-pay | Admitting: Family Medicine

## 2019-09-27 DIAGNOSIS — N3281 Overactive bladder: Secondary | ICD-10-CM

## 2019-12-29 ENCOUNTER — Ambulatory Visit: Payer: BC Managed Care – PPO | Attending: Family

## 2019-12-29 DIAGNOSIS — Z23 Encounter for immunization: Secondary | ICD-10-CM

## 2019-12-29 NOTE — Progress Notes (Signed)
   Covid-19 Vaccination Clinic  Name:  Paul Rush    MRN: [id number] DOB: 03/17/1991  12/29/2019  [name] was observed post Covid-19 immunization for 15 minutes without incident. He was provided with Vaccine Information Sheet and instruction to access the V-Safe system.   [name] was instructed to call 911 with any severe reactions post vaccine: Marland Kitchen Difficulty breathing  . Swelling of face and throat  . A fast heartbeat  . A bad rash all over body  . Dizziness and weakness   Immunizations Administered    Name Date Dose VIS Date Route   Moderna COVID-19 Vaccine 12/29/2019 10:16 AM 0.5 mL 08/30/2019 Intramuscular   Manufacturer: Moderna   Lot: [id number]   NDC: 07622-633-35

## 2020-01-31 ENCOUNTER — Ambulatory Visit: Payer: BC Managed Care – PPO | Attending: Family

## 2020-01-31 DIAGNOSIS — Z23 Encounter for immunization: Secondary | ICD-10-CM

## 2020-01-31 NOTE — Progress Notes (Signed)
   Covid-19 Vaccination Clinic  Name:  Paul Rush    MRN: [id number] DOB: 06/24/1991  01/31/2020  [name] was observed post Covid-19 immunization for 15 minutes without incident. He was provided with Vaccine Information Sheet and instruction to access the V-Safe system.   [name] was instructed to call 911 with any severe reactions post vaccine: Marland Kitchen Difficulty breathing  . Swelling of face and throat  . A fast heartbeat  . A bad rash all over body  . Dizziness and weakness   Immunizations Administered    Name Date Dose VIS Date Route   Moderna COVID-19 Vaccine 01/31/2020 10:06 AM 0.5 mL 08/2019 Intramuscular   Manufacturer: Moderna   Lot: [id number]   NDC: 98069-996-72

## 2020-02-24 ENCOUNTER — Encounter: Payer: Self-pay | Admitting: Nurse Practitioner

## 2020-02-24 ENCOUNTER — Telehealth (INDEPENDENT_AMBULATORY_CARE_PROVIDER_SITE_OTHER): Payer: BC Managed Care – PPO | Admitting: Nurse Practitioner

## 2020-02-24 DIAGNOSIS — R350 Frequency of micturition: Secondary | ICD-10-CM

## 2020-02-24 DIAGNOSIS — N3281 Overactive bladder: Secondary | ICD-10-CM | POA: Diagnosis not present

## 2020-02-24 MED ORDER — MIRABEGRON ER 25 MG PO TB24: 25.0000 mg | ORAL_TABLET | Freq: Every day | ORAL | 5 refills | Status: DC

## 2020-02-24 NOTE — Progress Notes (Signed)
Virtual Visit via Video Note  I connected with@ on 02/24/20 at  9:30 AM EDT by a video enabled telemedicine application and verified that I am speaking with the correct person using two identifiers.  Location: Patient:Home Provider: Office Participants: patient and provider  I discussed the limitations of evaluation and management by telemedicine and the availability of in person appointments. I also discussed with the patient that there may be a patient responsible charge related to this service. The patient expressed understanding and agreed to proceed.  CC:Pt states that this is the same issue he has had in the past. He has continued Detrol LA  as directed. The past week it has become a problem again with Sx of nocturia and then during the day urinary frequency even just after emptying his bladder. Denies any dysuria but states "It's a constant urge of having to go." Worse in last 1week.  History of Present Illness: Urinary Frequency  This is a chronic problem. The current episode started more than 1 year ago. The problem occurs every urination. The problem has been rapidly worsening. The pain is at a severity of 0/10. The patient is experiencing no pain. There has been no fever. He is sexually active. There is no history of pyelonephritis. Associated symptoms include frequency and urgency. Pertinent negatives include no chills, discharge, flank pain, hematuria, hesitancy, nausea, possible pregnancy, sweats or vomiting. Treatments tried: detrol LA x 61months. There is no history of catheterization, kidney stones, recurrent UTIs, a single kidney, urinary stasis or a urological procedure.  denies any risk for STI, no constipation, no hematuria Detrol LA improved symptoms for 11months, then got worse in last 1week. Denies any increase in ETOH or soda or caffeine intake, no OTC/new rx  medication used.   Observations/Objective: Physical Exam  Constitutional: He is oriented to person, place, and  time. No distress.  Unable to provide any vital signs  Pulmonary/Chest: Effort normal.  Neurological: He is alert and oriented to person, place, and time.   Assessment and Plan: Grayer was seen today for bladder issues.  Diagnoses and all orders for this visit:  OAB (overactive bladder) -     mirabegron ER (MYRBETRIQ) 25 MG TB24 tablet; Take 1 tablet (25 mg total) by mouth daily.  Urinary frequency -     Urinalysis w microscopic + reflex cultur; Future -     Urine cytology ancillary only([place]); Future   Follow Up Instructions: Hold detrol Start myrbetriq Go to lab for urine collection.  I discussed the assessment and treatment plan with the patient. The patient was provided an opportunity to ask questions and all were answered. The patient agreed with the plan and demonstrated an understanding of the instructions.   The patient was advised to call back or seek an in-person evaluation if the symptoms worsen or if the condition fails to improve as anticipated.   Alysia Penna, NP

## 2020-02-24 NOTE — Patient Instructions (Signed)
Hold detrol Start myrbetriq Go to lab for urine collection.

## 2020-02-28 ENCOUNTER — Other Ambulatory Visit (HOSPITAL_COMMUNITY)
Admission: RE | Admit: 2020-02-28 | Discharge: 2020-02-28 | Disposition: A | Discharge status: Home / Self Care | Payer: BC Managed Care – PPO | Source: Ambulatory Visit | Attending: Nurse Practitioner | Admitting: Nurse Practitioner

## 2020-02-28 ENCOUNTER — Other Ambulatory Visit: Payer: BC Managed Care – PPO

## 2020-02-28 ENCOUNTER — Other Ambulatory Visit: Payer: Self-pay

## 2020-02-28 DIAGNOSIS — R350 Frequency of micturition: Secondary | ICD-10-CM | POA: Diagnosis not present

## 2020-02-29 LAB — URINALYSIS W MICROSCOPIC + REFLEX CULTURE
Bacteria, UA: NONE SEEN /HPF
Bilirubin Urine: NEGATIVE
Glucose, UA: NEGATIVE
Hgb urine dipstick: NEGATIVE
Hyaline Cast: NONE SEEN /LPF
Ketones, ur: NEGATIVE
Leukocyte Esterase: NEGATIVE
Nitrites, Initial: NEGATIVE
Protein, ur: NEGATIVE
Specific Gravity, Urine: 1.023 (ref 1.001–1.03)
Squamous Epithelial / HPF: NONE SEEN /HPF (ref <=5)
WBC, UA: NONE SEEN /HPF (ref 0–5)
pH: 7 (ref 5.0–8.0)

## 2020-02-29 LAB — URINE CYTOLOGY ANCILLARY ONLY
Chlamydia: NEGATIVE
Comment: NEGATIVE
Comment: NORMAL
Neisseria Gonorrhea: NEGATIVE

## 2020-02-29 LAB — NO CULTURE INDICATED

## 2020-03-01 ENCOUNTER — Other Ambulatory Visit: Payer: BC Managed Care – PPO

## 2020-03-14 ENCOUNTER — Telehealth: Payer: Self-pay | Admitting: Family Medicine

## 2020-03-14 NOTE — Telephone Encounter (Signed)
Returned patients call no answer LMTCB, also asked patient to call back to schedule an appointment for re evaluation.

## 2020-03-14 NOTE — Telephone Encounter (Signed)
Patient is calling and stated he was seen on 5/28 by [facility] for a uti and is still experiencing the same symptoms. Patient wanted to know if he should make another appointment or if something else be sent to the pharmacy, please advise. CB is [phone]

## 2020-05-14 ENCOUNTER — Other Ambulatory Visit: Payer: Self-pay | Admitting: Family Medicine

## 2020-05-14 DIAGNOSIS — N3281 Overactive bladder: Secondary | ICD-10-CM

## 2020-10-18 ENCOUNTER — Ambulatory Visit: Payer: Self-pay | Attending: Internal Medicine

## 2020-10-18 DIAGNOSIS — Z23 Encounter for immunization: Secondary | ICD-10-CM

## 2020-10-18 NOTE — Progress Notes (Signed)
   Covid-19 Vaccination Clinic  Name:  Paul Rush    MRN: [id number] DOB: [birth year]  10/18/2020  Mr. Paul Rush was observed post Covid-19 immunization for 15 minutes without incident. He was provided with Vaccine Information Sheet and instruction to access the V-Safe system.   Mr. Paul Rush was instructed to call 911 with any severe reactions post vaccine: Marland Kitchen Difficulty breathing  . Swelling of face and throat  . A fast heartbeat  . A bad rash all over body  . Dizziness and weakness   Immunizations Administered    Name Date Dose VIS Date Route   Moderna Covid-19 Booster Vaccine 10/18/2020  3:29 PM 0.25 mL 07/18/2020 Intramuscular   Manufacturer: Moderna   Lot: [id number]   NDC: 23762-831-51

## 2020-12-06 ENCOUNTER — Other Ambulatory Visit: Payer: Self-pay

## 2020-12-07 ENCOUNTER — Encounter: Payer: Self-pay | Admitting: Family Medicine

## 2020-12-07 ENCOUNTER — Ambulatory Visit (INDEPENDENT_AMBULATORY_CARE_PROVIDER_SITE_OTHER): Payer: BC Managed Care – PPO | Admitting: Family Medicine

## 2020-12-07 VITALS — BP 120/82 | HR 75 | Temp 96.6°F | Ht 66.0 in | Wt 153.6 lb

## 2020-12-07 DIAGNOSIS — H8309 Labyrinthitis, unspecified ear: Secondary | ICD-10-CM

## 2020-12-07 DIAGNOSIS — N3281 Overactive bladder: Secondary | ICD-10-CM

## 2020-12-07 MED ORDER — MECLIZINE HCL 25 MG PO TABS: 25.0000 mg | ORAL_TABLET | Freq: Three times a day (TID) | ORAL | 0 refills | Status: DC | PRN

## 2020-12-07 NOTE — Progress Notes (Signed)
Established Patient Office Visit  Subjective:  Patient ID: Paul Rush, male    DOB: May 09, 1991  Age: 30 y.o. MRN: [id number]  CC:  Chief Complaint  Patient presents with   Acute Visit    Possible vertigo, pt states it started Saturday, pt states the room is currently spinning, states this happens between 12-14 times a day     HPI Paul Rush presents for evaluation of a 6-day history of a spinning sensation.  He describes this as things spinning around him when there is actually no movement.  Prior to the onset of this he had been experiencing his usual springtime allergy symptoms with itchy watery eyes sneezing nasal congestion and postnasal drip.  Allergies symptoms improved with the recent rains.  He has no prior history of this.  He has failed a mild increase in his heart rate at times with a spinning sensation.  There has been no overt shortness of breath or chest pain.  He does not smoke.  Lipid profile few years ago was essentially normal.  Mirabegron continues to help his irritable bladder symptoms.  Past Medical History:  Diagnosis Date   Asthma     Past Surgical History:  Procedure Laterality Date   CHOLECYSTECTOMY     WISDOM TOOTH EXTRACTION     WISDOM TOOTH EXTRACTION      Family History  Problem Relation Age of Onset   Healthy Mother     Social History   Socioeconomic History   Marital status: Married    Spouse name: Not on file   Number of children: Not on file   Years of education: Not on file   Highest education level: Not on file  Occupational History   Not on file  Tobacco Use   Smoking status: Never Smoker   Smokeless tobacco: Never Used  Substance and Sexual Activity   Alcohol use: Yes    Alcohol/week: 2.0 standard drinks    Types: 2 Standard drinks or equivalent per week   Drug use: No   Sexual activity: Yes    Partners: Male  Other Topics Concern   Not on file  Social History Narrative   Drinks 1-2 cups of  caffeine daily.   Social Determinants of Health   Financial Resource Strain: Not on file  Food Insecurity: Not on file  Transportation Needs: Not on file  Physical Activity: Not on file  Stress: Not on file  Social Connections: Not on file  Intimate Partner Violence: Not on file    Outpatient Medications Prior to Visit  Medication Sig Dispense Refill   mirabegron ER (MYRBETRIQ) 25 MG TB24 tablet Take 1 tablet (25 mg total) by mouth daily. 30 tablet 5   tolterodine (DETROL LA) 4 MG 24 hr capsule TAKE 1 CAPSULE(4 MG) BY MOUTH DAILY (Patient not taking: Reported on 12/07/2020) 90 capsule 1   No facility-administered medications prior to visit.    No Known Allergies  ROS Review of Systems  Constitutional: Negative.   HENT: Positive for congestion, postnasal drip and sneezing.   Eyes: Positive for itching. Negative for photophobia and visual disturbance.  Respiratory: Negative.  Negative for cough, shortness of breath and wheezing.   Cardiovascular: Negative.   Gastrointestinal: Negative.   Musculoskeletal: Negative.   Skin: Negative.   Allergic/Immunologic: Negative for immunocompromised state.  Neurological: Positive for dizziness. Negative for speech difficulty, weakness and numbness.  Hematological: Does not bruise/bleed easily.  Psychiatric/Behavioral: Negative.       Objective:  Physical Exam Constitutional:      General: He is not in acute distress.    Appearance: Normal appearance. He is normal weight. He is not ill-appearing, toxic-appearing or diaphoretic.  HENT:     Head: Normocephalic and atraumatic.     Right Ear: Tympanic membrane, ear canal and external ear normal.     Left Ear: Tympanic membrane, ear canal and external ear normal.     Mouth/Throat:     Mouth: Mucous membranes are moist.     Pharynx: Oropharynx is clear. No oropharyngeal exudate or posterior oropharyngeal erythema.  Eyes:     General: No scleral icterus.    Extraocular Movements:  Extraocular movements intact.     Conjunctiva/sclera: Conjunctivae normal.     Pupils: Pupils are equal, round, and reactive to light.  Cardiovascular:     Rate and Rhythm: Normal rate and regular rhythm.  Pulmonary:     Effort: Pulmonary effort is normal.     Breath sounds: Normal breath sounds.  Abdominal:     General: Bowel sounds are normal.  Musculoskeletal:     Cervical back: Neck supple. No rigidity or tenderness.  Lymphadenopathy:     Cervical: No cervical adenopathy.  Skin:    General: Skin is warm and dry.  Neurological:     General: No focal deficit present.     Mental Status: He is alert and oriented to person, place, and time.     Cranial Nerves: No cranial nerve deficit.  Psychiatric:        Mood and Affect: Mood normal.        Behavior: Behavior normal.     BP 120/82 (BP Location: Left Arm, Patient Position: Sitting, Cuff Size: Normal)    Pulse 75    Temp (!) 96.6 F (35.9 C) (Temporal)    Ht 5\' 6"  (1.676 m)    Wt 153 lb 9.6 oz (69.7 kg)    SpO2 99%    BMI 24.79 kg/m  Wt Readings from Last 3 Encounters:  12/07/20 153 lb 9.6 oz (69.7 kg)  12/13/18 149 lb 4 oz (67.7 kg)  04/06/18 140 lb (63.5 kg)     Health Maintenance Due  Topic Date Due   Hepatitis C Screening  Never done   TETANUS/TDAP  Never done   INFLUENZA VACCINE  Never done    There are no preventive care reminders to display for this patient.  No results found for: TSH Lab Results  Component Value Date   WBC 5.6 12/13/2018   HGB 15.2 12/13/2018   HCT 44.5 12/13/2018   MCV 89.0 12/13/2018   PLT 205.0 12/13/2018   Lab Results  Component Value Date   NA 139 12/13/2018   K 3.9 12/13/2018   CO2 29 12/13/2018   GLUCOSE 100 (H) 12/13/2018   BUN 14 12/13/2018   CREATININE 0.86 12/13/2018   BILITOT 1.0 12/13/2018   ALKPHOS 60 12/13/2018   AST 22 12/13/2018   ALT 12 12/13/2018   PROT 7.6 12/13/2018   ALBUMIN 5.1 12/13/2018   CALCIUM 9.8 12/13/2018   ANIONGAP 14 04/06/2018   GFR  104.37 12/13/2018   Lab Results  Component Value Date   CHOL 168 12/13/2018   Lab Results  Component Value Date   HDL 43.80 12/13/2018   Lab Results  Component Value Date   LDLCALC 113 (H) 12/13/2018   Lab Results  Component Value Date   TRIG 56.0 12/13/2018   Lab Results  Component Value Date   CHOLHDL  4 12/13/2018   No results found for: HGBA1C    Assessment & Plan:   Problem List Items Addressed This Visit      Nervous and Auditory   Labyrinthitis - Primary   Relevant Medications   meclizine (ANTIVERT) 25 MG tablet    Other Visit Diagnoses    OAB (overactive bladder)          Meds ordered this encounter  Medications   meclizine (ANTIVERT) 25 MG tablet    Sig: Take 1 tablet (25 mg total) by mouth 3 (three) times daily as needed for dizziness.    Dispense:  30 tablet    Refill:  0    Follow-up: Return if symptoms worsen or fail to improve.   Continue mirabegron for oab.  Mliss Sax, MD

## 2020-12-07 NOTE — Patient Instructions (Signed)
Labyrinthitis  Labyrinthitis is an inner ear infection. The inner ear is a system of tubes and canals (labyrinth) that are filled with fluid. The inner ear also contains nerve cells that send hearing and balance signals to the brain. When tiny germs (microorganisms) get inside the labyrinth, they harm the cells that send messages to the brain. This can cause changes in hearing and balance. Labyrinthitis usually develops suddenly and goes away with treatment in a few weeks (acute labyrinthitis). If the infection damages parts of the labyrinth, some symptoms may last for a long time (chronic labyrinthitis). What are the causes? Labyrinthitis is most often caused by a virus, such as one that causes:  Infectious mononucleosis, also called "mono."  Measles.  The flu.  Herpes. Labyrinthitis can also be caused by bacteria that spread from an infection in the brain or the middle ear (suppurative labyrinthitis). In some cases, the bacteria may produce a poison (toxin) that gets inside the labyrinth (serous labyrinthitis). What increases the risk? You may be at greater risk for labyrinthitis if you:  Recently had a mouth, nose, or throat infection (upper respiratory infection) or an ear infection.  Drink a lot of alcohol.  Smoke.  Use certain drugs.  Are not well-rested (are fatigued).  Are experiencing a lot of stress.  Have allergies. What are the signs or symptoms? Symptoms of labyrinthitis usually start suddenly. The symptoms may range from mild to severe, and may include:  Dizziness.  Hearing loss.  A feeling that you or your surroundings are moving when they are not (vertigo).  Ringing in your ear (tinnitus).  Nausea and vomiting.  Trouble focusing your eyes. Symptoms of chronic labyrinthitis may include:  Fatigue.  Confusion.  Hearing loss.  Tinnitus.  Poor balance.  Vertigo after sudden head movements. How is this diagnosed? This condition may be diagnosed  based on:  Your symptoms and medical history. Your health care provider may ask about any dizziness or hearing loss you have and any recent upper respiratory infections.  A physical exam that involves: ? Checking your ears for infection. ? Testing your balance. ? Checking your eye movement.  Hearing tests.  Imaging tests, such as CT scan or MRI.  Tests of your eye movements (electronystagmogram, ENG). How is this treated? Treatment depends on the cause. If your condition is caused by bacteria, you may need antibiotic medicine. If it is caused by a virus, it may get better on its own. Regardless of the cause, you may be treated with:  Medicines to: ? Stop dizziness. ? Relieve nausea. ? Reduce inflammation. ? Speed up your recovery.  Rest. You may be asked to limit your activities until dizziness goes away.  IV fluids. These may be given at a hospital. You may need IV fluids if you have severe nausea and vomiting.  Physical therapy. A therapist can teach you exercises to help you adjust to feeling dizzy (vestibular rehabilitation exercises). You may need this if you have dizziness that does not go away. Follow these instructions at home: Medicines  Take over-the-counter and prescription medicines only as told by your health care provider.  If you were prescribed an antibiotic medicine, take it exactly as told by your health care provider. Do not stop taking the antibiotic even if you start to feel better. Activity  Rest as much as possible.  Limit your activity as directed. Ask your health care provider what activities are safe for you.  Do not make sudden movements until any dizziness goes   away.  If physical therapy was prescribed, do exercises as directed. General instructions  Avoid loud noises and bright lights.  Do not drive until your health care provider says that this is safe for you.  Drink enough fluid to keep your urine pale yellow.  Keep all follow-up  visits as told by your health care provider. This is important. Contact a health care provider if you have:  Symptoms that do not get better with medicine.  Symptoms that last longer than two weeks.  A fever. Get help right away if you have:  Nausea or vomiting that is severe or does not go away.  Severe dizziness.  Sudden hearing loss. Summary  Labyrinthitis is an infection of the inner ear. It can cause changes in hearing and balance.  Symptoms usually start suddenly and include dizziness, hearing loss, and nausea and vomiting. You may also have ringing in your ear (tinnitus), trouble focusing your eyes, and a feeling that you or your surroundings are moving when they are not (vertigo).  If the condition lasts more than a few weeks, symptoms may include fatigue, confusion, hearing loss, poor balance, tinnitus, and vertigo.  Treatment depends on the cause. If your labyrinthitis is caused by bacteria, you may need antibiotic medicine. If your labyrinthitis is caused by a virus, it may get better on its own.  Follow your health care provider's instructions, including how to take medicines, what activities to avoid, and when to get medical help. This information is not intended to replace advice given to you by your health care provider. Make sure you discuss any questions you have with your health care provider. Document Revised: 05/18/2020 Document Reviewed: 05/18/2020 Elsevier Patient Education  2021 Elsevier Inc. Meclizine tablets or capsules What is this medicine? MECLIZINE (MEK li zeen) is an antihistamine. It is used to prevent nausea, vomiting, or dizziness caused by motion sickness. It is also used to prevent and treat vertigo (extreme dizziness or a feeling that you or your surroundings are tilting or spinning around). This medicine may be used for other purposes; ask your health care provider or pharmacist if you have questions. COMMON BRAND NAME(S): Antivert, Dramamine  Less Drowsy, Dramamine-N, Medivert, Meni-D What should I tell my health care provider before I take this medicine? They need to know if you have any of these conditions:  glaucoma  lung or breathing disease, like asthma  problems urinating  prostate disease  stomach or intestine problems  an unusual or allergic reaction to meclizine, other medicines, foods, dyes, or preservatives  pregnant or trying to get pregnant  breast-feeding How should I use this medicine? Take this medicine by mouth with a glass of water. Follow the directions on the prescription label. If you are using this medicine to prevent motion sickness, take the dose at least 1 hour before travel. If it upsets your stomach, take it with food or milk. Take your doses at regular intervals. Do not take your medicine more often than directed. Talk to your pediatrician regarding the use of this medicine in children. Special care may be needed. Overdosage: If you think you have taken too much of this medicine contact a poison control center or emergency room at once. NOTE: This medicine is only for you. Do not share this medicine with others. What if I miss a dose? If you miss a dose, take it as soon as you can. If it is almost time for your next dose, take only that dose. Do not take double   or extra doses. What may interact with this medicine? Do not take this medicine with any of the following medications:  MAOIs like Carbex, Eldepryl, Marplan, Nardil, and Parnate This medicine may also interact with the following medications:  alcohol  antihistamines for allergy, cough and cold  certain medicines for anxiety or sleep  certain medicines for depression, like amitriptyline, fluoxetine, sertraline  certain medicines for seizures like phenobarbital, primidone  general anesthetics like halothane, isoflurane, methoxyflurane, propofol  local anesthetics like lidocaine, pramoxine, tetracaine  medicines that relax  muscles for surgery  narcotic medicines for pain  phenothiazines like chlorpromazine, mesoridazine, prochlorperazine, thioridazine This list may not describe all possible interactions. Give your health care provider a list of all the medicines, herbs, non-prescription drugs, or dietary supplements you use. Also tell them if you smoke, drink alcohol, or use illegal drugs. Some items may interact with your medicine. What should I watch for while using this medicine? Tell your doctor or healthcare professional if your symptoms do not start to get better or if they get worse. You may get drowsy or dizzy. Do not drive, use machinery, or do anything that needs mental alertness until you know how this medicine affects you. Do not stand or sit up quickly, especially if you are an older patient. This reduces the risk of dizzy or fainting spells. Alcohol may interfere with the effect of this medicine. Avoid alcoholic drinks. Your mouth may get dry. Chewing sugarless gum or sucking hard candy, and drinking plenty of water may help. Contact your doctor if the problem does not go away or is severe. This medicine may cause dry eyes and blurred vision. If you wear contact lenses you may feel some discomfort. Lubricating drops may help. See your eye doctor if the problem does not go away or is severe. What side effects may I notice from receiving this medicine? Side effects that you should report to your doctor or health care professional as soon as possible:  feeling faint or lightheaded, falls  fast, irregular heartbeat Side effects that usually do not require medical attention (report to your doctor or health care professional if they continue or are bothersome):  constipation  headache  trouble passing urine or change in the amount of urine  trouble sleeping  upset stomach This list may not describe all possible side effects. Call your doctor for medical advice about side effects. You may report side  effects to FDA at 1-800-FDA-1088. Where should I keep my medicine? Keep out of the reach of children. Store at room temperature between 15 and 30 degrees C (59 and 86 degrees F). Keep container tightly closed. Throw away any unused medicine after the expiration date. NOTE: This sheet is a summary. It may not cover all possible information. If you have questions about this medicine, talk to your doctor, pharmacist, or health care provider.  2021 Elsevier/Gold Standard (2015-10-17 19:41:02)  

## 2021-03-26 ENCOUNTER — Ambulatory Visit: Payer: BC Managed Care – PPO | Admitting: Sports Medicine

## 2021-04-24 ENCOUNTER — Telehealth: Payer: BC Managed Care – PPO | Admitting: Physician Assistant

## 2021-04-24 DIAGNOSIS — U071 COVID-19: Secondary | ICD-10-CM | POA: Diagnosis not present

## 2021-04-24 MED ORDER — FLUTICASONE PROPIONATE 50 MCG/ACT NA SUSP: 2.0000 | Freq: Every day | NASAL | 0 refills | Status: DC

## 2021-04-24 MED ORDER — BENZONATATE 100 MG PO CAPS: 100.0000 mg | ORAL_CAPSULE | Freq: Three times a day (TID) | ORAL | 0 refills | Status: DC | PRN

## 2021-04-24 MED ORDER — MOLNUPIRAVIR EUA 200MG CAPSULE: 4.0000 | ORAL_CAPSULE | Freq: Two times a day (BID) | ORAL | 0 refills | Status: AC

## 2021-04-24 NOTE — Patient Instructions (Signed)
  Paul Rush, thank you for joining Piedad Climes, PA-C for today's virtual visit.  While this provider is not your primary care provider (PCP), if your PCP is located in our provider database this encounter information will be shared with them immediately following your visit.  Consent: (Patient) Paul Rush provided verbal consent for this virtual visit at the beginning of the encounter.  Current Medications:  Current Outpatient Medications:    meclizine (ANTIVERT) 25 MG tablet, Take 1 tablet (25 mg total) by mouth 3 (three) times daily as needed for dizziness., Disp: 30 tablet, Rfl: 0   mirabegron ER (MYRBETRIQ) 25 MG TB24 tablet, Take 1 tablet (25 mg total) by mouth daily., Disp: 30 tablet, Rfl: 5   Medications ordered in this encounter:  No orders of the defined types were placed in this encounter.    *If you need refills on other medications prior to your next appointment, please contact your pharmacy*  Follow-Up: Call back or seek an in-person evaluation if the symptoms worsen or if the condition fails to improve as anticipated.  Other Instructions Please keep well-hydrated and get plenty of rest. Start a saline nasal rinse to flush out your nasal passages. You can use plain Mucinex to help thin congestion. If you have a humidifier, running in the bedroom at night. I want you to start OTC vitamin D3 1000 units daily, vitamin C 1000 mg daily, and a zinc supplement. Please take prescribed medications as directed.  You have been enrolled in a MyChart symptom monitoring program. Please answer these questions daily so we can keep track of how you are doing.  You were to quarantine for 5 days from onset of your symptoms.  After day 5, if you have had no fever and you are feeling better, you can end quarantine but need to mask for an additional 5 days. After day 5 if you have a fever or are having significant symptoms, please quarantine for full 10 days.  If you  note any worsening of symptoms, any significant shortness of breath or any chest pain, please seek ER evaluation ASAP.  Please do not delay care!    If you have been instructed to have an in-person evaluation today at a local Urgent Care facility, please use the link below. It will take you to a list of all of our available [facility], including address, phone number and hours of operation. Please do not delay care.  [facility]  If you or a family member do not have a primary care provider, use the link below to schedule a visit and establish care. When you choose a [facility] primary care physician or advanced practice provider, you gain a long-term partner in health. Find a Primary Care Provider  Learn more about [facility]'s in-office and virtual care options: [facility] - Get Care Now

## 2021-04-24 NOTE — Progress Notes (Signed)
Virtual Visit Consent   Paul Rush, you are scheduled for a virtual visit with a [facility] provider today.     Just as with appointments in the office, your consent must be obtained to participate.  Your consent will be active for this visit and any virtual visit you may have with one of our providers in the next 365 days.     If you have a MyChart account, a copy of this consent can be sent to you electronically.  All virtual visits are billed to your insurance company just like a traditional visit in the office.    As this is a virtual visit, video technology does not allow for your provider to perform a traditional examination.  This may limit your provider's ability to fully assess your condition.  If your provider identifies any concerns that need to be evaluated in person or the need to arrange testing (such as labs, EKG, etc.), we will make arrangements to do so.     Although advances in technology are sophisticated, we cannot ensure that it will always work on either your end or our end.  If the connection with a video visit is poor, the visit may have to be switched to a telephone visit.  With either a video or telephone visit, we are not always able to ensure that we have a secure connection.     I need to obtain your verbal consent now.   Are you willing to proceed with your visit today?    Paul Rush has provided verbal consent on 04/24/2021 for a virtual visit (video or telephone).   Piedad Climes, [place]   Date: 04/24/2021 3:55 PM  Virtual Visit via Video Note   I, Piedad Climes, connected with  Paul Rush  ([id number], 09-19-1991) on 04/24/21 at  3:45 PM EDT by a video-enabled telemedicine application and verified that I am speaking with the correct person using two identifiers.  Location: Patient: Virtual Visit Location Patient: Home Provider: Virtual Visit Location Provider: Home Office   I discussed the limitations of evaluation and  management by telemedicine and the availability of in person appointments. The patient expressed understanding and agreed to proceed.    History of Present Illness: Paul Rush is a 30 y.o. who identifies as a male who was assigned male at birth, and is being seen today for COVID-19. Patient endorses symptoms starting Saturday with mild sinus pressure and fatigue, worsening over the next day. Started developing chills, sweats, fever with Tmax 102, dry cough. As such had COVID test which was positive. Has been taking Tylenol, Ibuprofen and Robitussin alternating with Nyquil. Is not noting any improvement in his symptoms.   HPI: HPI  Problems:  Patient Active Problem Rush   Diagnosis Date Noted   Labyrinthitis 12/07/2020    Allergies: No Known Allergies Medications:  Current Outpatient Medications:    benzonatate (TESSALON) 100 MG capsule, Take 1 capsule (100 mg total) by mouth 3 (three) times daily as needed for cough., Disp: 30 capsule, Rfl: 0   fluticasone (FLONASE) 50 MCG/ACT nasal spray, Place 2 sprays into both nostrils daily., Disp: 16 g, Rfl: 0   molnupiravir EUA 200 mg CAPS, Take 4 capsules (800 mg total) by mouth 2 (two) times daily for 5 days., Disp: 40 capsule, Rfl: 0   meclizine (ANTIVERT) 25 MG tablet, Take 1 tablet (25 mg total) by mouth 3 (three) times daily as needed for dizziness., Disp: 30 tablet, Rfl: 0  mirabegron ER (MYRBETRIQ) 25 MG TB24 tablet, Take 1 tablet (25 mg total) by mouth daily., Disp: 30 tablet, Rfl: 5  Observations/Objective: Patient is well-developed, well-nourished in no acute distress.  Resting comfortably at home.  Head is normocephalic, atraumatic.  No labored breathing. Speech is clear and coherent with logical content.  Patient is alert and oriented at baseline.   Assessment and Plan: 1. COVID-19 - fluticasone (FLONASE) 50 MCG/ACT nasal spray; Place 2 sprays into both nostrils daily.  Dispense: 16 g; Refill: 0 - benzonatate (TESSALON) 100  MG capsule; Take 1 capsule (100 mg total) by mouth 3 (three) times daily as needed for cough.  Dispense: 30 capsule; Refill: 0 - molnupiravir EUA 200 mg CAPS; Take 4 capsules (800 mg total) by mouth 2 (two) times daily for 5 days.  Dispense: 40 capsule; Refill: 0 Patient with lower risk of complications but significant symptomology. He would like to start antiviral. Discussed risks/benefits of antiviral medications including most common potential ADRs. Patient voiced understanding and would like to proceed with antiviral medication. They are candidate for molnupiravir. Rx sent to pharmacy. Supportive measures, OTC medications and vitamin regimen reviewed. Flonase and Tessalon per orders. Patient has been enrolled in a MyChart COVID symptom monitoring program. Anne Shutter reviewed in detail. Strict ER precautions discussed with patient.    Follow Up Instructions: I discussed the assessment and treatment plan with the patient. The patient was provided an opportunity to ask questions and all were answered. The patient agreed with the plan and demonstrated an understanding of the instructions.  A copy of instructions were sent to the patient via MyChart.  The patient was advised to call back or seek an in-person evaluation if the symptoms worsen or if the condition fails to improve as anticipated.  Time:  I spent 15 minutes with the patient via telehealth technology discussing the above problems/concerns.    Piedad Climes, PA-C

## 2021-04-29 ENCOUNTER — Encounter: Payer: Self-pay | Admitting: Family

## 2021-04-29 ENCOUNTER — Telehealth: Payer: BC Managed Care – PPO | Admitting: Family

## 2021-04-29 DIAGNOSIS — U071 COVID-19: Secondary | ICD-10-CM | POA: Diagnosis not present

## 2021-04-29 DIAGNOSIS — J4541 Moderate persistent asthma with (acute) exacerbation: Secondary | ICD-10-CM

## 2021-04-29 MED ORDER — ALBUTEROL SULFATE HFA 108 (90 BASE) MCG/ACT IN AERS: 2.0000 | INHALATION_SPRAY | Freq: Four times a day (QID) | RESPIRATORY_TRACT | 0 refills | Status: DC | PRN

## 2021-04-29 MED ORDER — PROMETHAZINE-DM 6.25-15 MG/5ML PO SYRP: 5.0000 mL | ORAL_SOLUTION | Freq: Three times a day (TID) | ORAL | 0 refills | Status: DC | PRN

## 2021-04-29 MED ORDER — PREDNISONE 10 MG (21) PO TBPK: ORAL_TABLET | ORAL | 0 refills | Status: DC

## 2021-04-29 NOTE — Progress Notes (Signed)
Virtual Visit Consent   Paul Rush, you are scheduled for a virtual visit with a [facility] provider today.     Just as with appointments in the office, your consent must be obtained to participate.  Your consent will be active for this visit and any virtual visit you may have with one of our providers in the next 365 days.     If you have a MyChart account, a copy of this consent can be sent to you electronically.  All virtual visits are billed to your insurance company just like a traditional visit in the office.    As this is a virtual visit, video technology does not allow for your provider to perform a traditional examination.  This may limit your provider's ability to fully assess your condition.  If your provider identifies any concerns that need to be evaluated in person or the need to arrange testing (such as labs, EKG, etc.), we will make arrangements to do so.     Although advances in technology are sophisticated, we cannot ensure that it will always work on either your end or our end.  If the connection with a video visit is poor, the visit may have to be switched to a telephone visit.  With either a video or telephone visit, we are not always able to ensure that we have a secure connection.     I need to obtain your verbal consent now.   Are you willing to proceed with your visit today?    Paul Rush has provided verbal consent on 04/29/2021 for a virtual visit (video or telephone).   Jannifer Rodney, FNP   Date: 04/29/2021 7:13 PM   Virtual Visit via Video Note   I, Jannifer Rodney, connected with  Paul Rush  ([id number], [birth year]) on 04/29/21 at  7:15 PM EDT by a video-enabled telemedicine application and verified that I am speaking with the correct person using two identifiers.  Location: Patient: Virtual Visit Location Patient: Home Provider: Virtual Visit Location Provider: Home   I discussed the limitations of evaluation and management by  telemedicine and the availability of in person appointments. The patient expressed understanding and agreed to proceed.    History of Present Illness: Paul Rush is a 30 y.o. who identifies as a male who was assigned male at birth, and is being seen today for cough. He was diagnosed with COVID 04/21/21.   HPI: Cough This is a recurrent problem. The current episode started 1 to 4 weeks ago. The problem has been waxing and waning. The problem occurs every few minutes. The cough is Non-productive. Pertinent negatives include no chills, ear congestion, ear pain, fever, headaches, myalgias, nasal congestion, postnasal drip, shortness of breath, weight loss or wheezing. The symptoms are aggravated by lying down. He has tried rest for the symptoms. His past medical history is significant for asthma. There is no history of COPD.   Problems:  Patient Active Problem List   Diagnosis Date Noted   Labyrinthitis 12/07/2020    Allergies: No Known Allergies Medications:  Current Outpatient Medications:    albuterol (VENTOLIN HFA) 108 (90 Base) MCG/ACT inhaler, Inhale 2 puffs into the lungs every 6 (six) hours as needed for wheezing or shortness of breath., Disp: 8 g, Rfl: 0   predniSONE (STERAPRED UNI-PAK 21 TAB) 10 MG (21) TBPK tablet, Use as directed, Disp: 21 tablet, Rfl: 0   promethazine-dextromethorphan (PROMETHAZINE-DM) 6.25-15 MG/5ML syrup, Take 5 mLs by mouth 3 (three) times  daily as needed for cough., Disp: 118 mL, Rfl: 0   benzonatate (TESSALON) 100 MG capsule, Take 1 capsule (100 mg total) by mouth 3 (three) times daily as needed for cough., Disp: 30 capsule, Rfl: 0   fluticasone (FLONASE) 50 MCG/ACT nasal spray, Place 2 sprays into both nostrils daily., Disp: 16 g, Rfl: 0   meclizine (ANTIVERT) 25 MG tablet, Take 1 tablet (25 mg total) by mouth 3 (three) times daily as needed for dizziness., Disp: 30 tablet, Rfl: 0   mirabegron ER (MYRBETRIQ) 25 MG TB24 tablet, Take 1 tablet (25 mg total)  by mouth daily., Disp: 30 tablet, Rfl: 5   molnupiravir EUA 200 mg CAPS, Take 4 capsules (800 mg total) by mouth 2 (two) times daily for 5 days., Disp: 40 capsule, Rfl: 0  Observations/Objective: Patient is well-developed, well-nourished in no acute distress.  Resting comfortably  at home.  Head is normocephalic, atraumatic.  No labored breathing.  Speech is clear and coherent with logical content.  Patient is alert and oriented at baseline.  Constant tight nonproductive cough  Assessment and Plan: 1. COVID-19 virus detected  2. Moderate persistent asthma with acute exacerbation - predniSONE (STERAPRED UNI-PAK 21 TAB) 10 MG (21) TBPK tablet; Use as directed  Dispense: 21 tablet; Refill: 0 - albuterol (VENTOLIN HFA) 108 (90 Base) MCG/ACT inhaler; Inhale 2 puffs into the lungs every 6 (six) hours as needed for wheezing or shortness of breath.  Dispense: 8 g; Refill: 0 - promethazine-dextromethorphan (PROMETHAZINE-DM) 6.25-15 MG/5ML syrup; Take 5 mLs by mouth 3 (three) times daily as needed for cough.  Dispense: 118 mL; Refill: 0 Rest Force fluids Tylenol as needed Symptoms improving from COVID.   Follow Up Instructions: I discussed the assessment and treatment plan with the patient. The patient was provided an opportunity to ask questions and all were answered. The patient agreed with the plan and demonstrated an understanding of the instructions.  A copy of instructions were sent to the patient via MyChart.  The patient was advised to call back or seek an in-person evaluation if the symptoms worsen or if the condition fails to improve as anticipated.  Time:  I spent 6 minutes with the patient via telehealth technology discussing the above problems/concerns.    Jannifer Rodney, FNP

## 2021-05-17 ENCOUNTER — Other Ambulatory Visit: Payer: Self-pay | Admitting: Family

## 2021-05-17 DIAGNOSIS — J4541 Moderate persistent asthma with (acute) exacerbation: Secondary | ICD-10-CM

## 2021-06-20 ENCOUNTER — Ambulatory Visit: Payer: BC Managed Care – PPO | Admitting: Family Medicine

## 2021-06-20 ENCOUNTER — Other Ambulatory Visit: Payer: Self-pay

## 2021-06-20 ENCOUNTER — Encounter: Payer: Self-pay | Admitting: Family Medicine

## 2021-06-20 VITALS — BP 115/72 | HR 87 | Temp 97.7°F | Ht 66.0 in | Wt 145.8 lb

## 2021-06-20 DIAGNOSIS — G43109 Migraine with aura, not intractable, without status migrainosus: Secondary | ICD-10-CM | POA: Insufficient documentation

## 2021-06-20 DIAGNOSIS — F418 Other specified anxiety disorders: Secondary | ICD-10-CM | POA: Diagnosis not present

## 2021-06-20 DIAGNOSIS — G43101 Migraine with aura, not intractable, with status migrainosus: Secondary | ICD-10-CM

## 2021-06-20 DIAGNOSIS — Z23 Encounter for immunization: Secondary | ICD-10-CM | POA: Diagnosis not present

## 2021-06-20 MED ORDER — ONDANSETRON HCL 4 MG PO TABS: 4.0000 mg | ORAL_TABLET | Freq: Three times a day (TID) | ORAL | 0 refills | Status: DC | PRN

## 2021-06-20 MED ORDER — PREDNISONE 10 MG (21) PO TBPK: ORAL_TABLET | ORAL | 0 refills | Status: DC

## 2021-06-20 MED ORDER — VENLAFAXINE HCL ER 75 MG PO CP24: ORAL_CAPSULE | ORAL | 1 refills | Status: DC

## 2021-06-20 NOTE — Progress Notes (Signed)
Established Patient Office Visit  Subjective:  Patient ID: Paul Rush, male    DOB: 09/10/1991  Age: 30 y.o. MRN: [id number]  CC:  Chief Complaint  Patient presents with   Migraine    Migraines and anxiety x 1 month becoming worse feels as if work and family issues are not helping. Patient feeling nauseous    HPI Paul Rush presents for evaluation and treatment for stress anxiety depression and headaches.  Patient has been stressed at work over the last 8 to 9 months.  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  Things became worse when his was grandmother was diagnosed with liver cancer back in 15-Mar-2023.  She died shortly thereafter in 04/14/23.  He has been having headaches over the last 2 weeks.  They have been his usual migraine headache preceded by scotomata, or and pounding one side of the head or the other.  More recently they have been associated with nausea vomiting and dizziness.  At this point the headache is constant.  He is a experiencing it with light and noise sensitivity.  He had to leave work today.  He is experiencing a headache right now.  He has been taking Tylenol on a daily basis over the last few weeks.  The headache has become worse this week.  Past Medical History:  Diagnosis Date   Asthma     Past Surgical History:  Procedure Laterality Date   CHOLECYSTECTOMY     WISDOM TOOTH EXTRACTION     WISDOM TOOTH EXTRACTION      Family History  Problem Relation Age of Onset   Healthy Mother     Social History   Socioeconomic History   Marital status: Married    Spouse name: Not on file   Number of children: Not on file   Years of education: Not on file   Highest education level: Not on file  Occupational History   Not on file  Tobacco Use   Smoking status: Never   Smokeless tobacco: Never  Substance and Sexual Activity   Alcohol use: Yes    Alcohol/week: 2.0 standard drinks    Types: 2 Standard drinks or equivalent per week   Drug use: No   Sexual  activity: Yes    Partners: Male  Other Topics Concern   Not on file  Social History Narrative   Drinks 1-2 cups of caffeine daily.   Social Determinants of Health   Financial Resource Strain: Not on file  Food Insecurity: Not on file  Transportation Needs: Not on file  Physical Activity: Not on file  Stress: Not on file  Social Connections: Not on file  Intimate Partner Violence: Not on file    Outpatient Medications Prior to Visit  Medication Sig Dispense Refill   albuterol (VENTOLIN HFA) 108 (90 Base) MCG/ACT inhaler Inhale 2 puffs into the lungs every 6 (six) hours as needed for wheezing or shortness of breath. 8 g 0   meclizine (ANTIVERT) 25 MG tablet Take 1 tablet (25 mg total) by mouth 3 (three) times daily as needed for dizziness. 30 tablet 0   mirabegron ER (MYRBETRIQ) 25 MG TB24 tablet Take 1 tablet (25 mg total) by mouth daily. 30 tablet 5   fluticasone (FLONASE) 50 MCG/ACT nasal spray Place 2 sprays into both nostrils daily. (Patient not taking: Reported on 06/20/2021) 16 g 0   benzonatate (TESSALON) 100 MG capsule Take 1 capsule (100 mg total) by mouth 3 (three) times daily as needed  for cough. 30 capsule 0   predniSONE (STERAPRED UNI-PAK 21 TAB) 10 MG (21) TBPK tablet Use as directed 21 tablet 0   promethazine-dextromethorphan (PROMETHAZINE-DM) 6.25-15 MG/5ML syrup Take 5 mLs by mouth 3 (three) times daily as needed for cough. 118 mL 0   No facility-administered medications prior to visit.    No Known Allergies  ROS Review of Systems  Constitutional: Negative.   Respiratory: Negative.    Cardiovascular: Negative.   Gastrointestinal: Negative.   Neurological:  Positive for dizziness and headaches. Negative for speech difficulty and weakness.  Psychiatric/Behavioral:  Positive for dysphoric mood. The patient is nervous/anxious.      Depression screen [facility] 2/9 06/20/2021 06/20/2021  Decreased Interest 3 0  Down, Depressed, Hopeless 2 1  PHQ - 2 Score 5 1  Altered  sleeping 3 -  Tired, decreased energy 3 -  Change in appetite 2 -  Feeling bad or failure about yourself  2 -  Trouble concentrating 3 -  Moving slowly or fidgety/restless 2 -  Suicidal thoughts 0 -  PHQ-9 Score 20 -  Difficult doing work/chores Somewhat difficult -   GAD 7 : Generalized Anxiety Score 06/20/2021  Nervous, Anxious, on Edge 3  Control/stop worrying 3  Worry too much - different things 3  Trouble relaxing 3  Restless 2  Easily annoyed or irritable 2  Afraid - awful might happen 3  Total GAD 7 Score 19  Anxiety Difficulty Somewhat difficult      Objective:    Physical Exam Vitals and nursing note reviewed.  Constitutional:      General: He is not in acute distress.    Appearance: Normal appearance. He is normal weight. He is not ill-appearing, toxic-appearing or diaphoretic.  HENT:     Head: Normocephalic and atraumatic.     Right Ear: Tympanic membrane, ear canal and external ear normal.     Left Ear: Tympanic membrane, ear canal and external ear normal.     Mouth/Throat:     Mouth: Mucous membranes are moist.     Pharynx: Oropharynx is clear. No oropharyngeal exudate or posterior oropharyngeal erythema.  Eyes:     Extraocular Movements: Extraocular movements intact.     Conjunctiva/sclera: Conjunctivae normal.     Pupils: Pupils are equal, round, and reactive to light.  Neck:     Vascular: No carotid bruit.  Cardiovascular:     Rate and Rhythm: Normal rate and regular rhythm.  Pulmonary:     Effort: Pulmonary effort is normal.     Breath sounds: Normal breath sounds.  Abdominal:     General: Bowel sounds are normal.  Musculoskeletal:     Cervical back: No rigidity or tenderness.  Lymphadenopathy:     Cervical: No cervical adenopathy.  Skin:    General: Skin is warm and dry.  Neurological:     Mental Status: He is alert and oriented to person, place, and time.  Psychiatric:        Mood and Affect: Mood normal.        Behavior: Behavior normal.         Thought Content: Thought content normal.    BP 115/72   Pulse 87   Temp 97.7 F (36.5 C) (Temporal)   Ht 5\' 6"  (1.676 m)   Wt 145 lb 12.8 oz (66.1 kg)   SpO2 96%   BMI 23.53 kg/m  Wt Readings from Last 3 Encounters:  06/20/21 145 lb 12.8 oz (66.1 kg)  12/07/20 153 lb  9.6 oz (69.7 kg)  12/13/18 149 lb 4 oz (67.7 kg)     Health Maintenance Due  Topic Date Due   Hepatitis C Screening  Never done   TETANUS/TDAP  Never done    There are no preventive care reminders to display for this patient.  No results found for: TSH Lab Results  Component Value Date   WBC 5.6 12/13/2018   HGB 15.2 12/13/2018   HCT 44.5 12/13/2018   MCV 89.0 12/13/2018   PLT 205.0 12/13/2018   Lab Results  Component Value Date   NA 139 12/13/2018   K 3.9 12/13/2018   CO2 29 12/13/2018   GLUCOSE 100 (H) 12/13/2018   BUN 14 12/13/2018   CREATININE 0.86 12/13/2018   BILITOT 1.0 12/13/2018   ALKPHOS 60 12/13/2018   AST 22 12/13/2018   ALT 12 12/13/2018   PROT 7.6 12/13/2018   ALBUMIN 5.1 12/13/2018   CALCIUM 9.8 12/13/2018   ANIONGAP 14 04/06/2018   GFR 104.37 12/13/2018   Lab Results  Component Value Date   CHOL 168 12/13/2018   Lab Results  Component Value Date   HDL 43.80 12/13/2018   Lab Results  Component Value Date   LDLCALC 113 (H) 12/13/2018   Lab Results  Component Value Date   TRIG 56.0 12/13/2018   Lab Results  Component Value Date   CHOLHDL 4 12/13/2018   No results found for: HGBA1C    Assessment & Plan:   Problem List Items Addressed This Visit       Cardiovascular and Mediastinum   Migraine with aura and without status migrainosus, not intractable   Relevant Medications   predniSONE (STERAPRED UNI-PAK 21 TAB) 10 MG (21) TBPK tablet   ondansetron (ZOFRAN) 4 MG tablet   venlafaxine XR (EFFEXOR XR) 75 MG 24 hr capsule     Other   Depression with anxiety   Relevant Medications   venlafaxine XR (EFFEXOR XR) 75 MG 24 hr capsule   Other Visit  Diagnoses     Need for influenza vaccination    -  Primary   Relevant Orders   Flu Vaccine QUAD 6+ mos PF IM (Fluarix Quad PF) (Completed)       Meds ordered this encounter  Medications   predniSONE (STERAPRED UNI-PAK 21 TAB) 10 MG (21) TBPK tablet    Sig: Take 6 today, 5 tomorrow, 4 the next day and then 3, 2, 1 and stop    Dispense:  21 tablet    Refill:  0   ondansetron (ZOFRAN) 4 MG tablet    Sig: Take 1 tablet (4 mg total) by mouth every 8 (eight) hours as needed for nausea or vomiting.    Dispense:  20 tablet    Refill:  0   venlafaxine XR (EFFEXOR XR) 75 MG 24 hr capsule    Sig: Take one each morning for one week and then increase to 2 each morning.    Dispense:  60 capsule    Refill:  1    Follow-up: Return in about 4 weeks (around 07/18/2021).   Believe that the patient is experiencing rebound phenomena.  Discussed starting prednisone Dosepak tomorrow.  He will use Zofran as needed for nausea and vomiting.  No start Effexor XR 75 mg each morning for a week and then increase to 2 each morning.  Follow-up in 1 month. Mliss Sax, MD

## 2021-07-02 ENCOUNTER — Ambulatory Visit: Payer: BC Managed Care – PPO | Admitting: Family Medicine

## 2021-08-28 ENCOUNTER — Other Ambulatory Visit: Payer: Self-pay | Admitting: Family Medicine

## 2021-08-28 DIAGNOSIS — F418 Other specified anxiety disorders: Secondary | ICD-10-CM

## 2021-08-28 NOTE — Telephone Encounter (Signed)
Patient notified VIA phone and scheduled for 10/08/21 @ 2:00 pm. Dm/cma

## 2021-08-28 NOTE — Telephone Encounter (Signed)
Refill request for: Venlafaxine 75 mg LR 06/20/21, #60, 1 rf LOV 06/20/21 FOV  none scheduled.   Please review and advise. Thanks .Dm/cma

## 2021-10-07 ENCOUNTER — Other Ambulatory Visit: Payer: Self-pay

## 2021-10-08 ENCOUNTER — Ambulatory Visit: Payer: BC Managed Care – PPO | Admitting: Family Medicine

## 2021-10-22 ENCOUNTER — Other Ambulatory Visit: Payer: Self-pay

## 2021-10-22 ENCOUNTER — Ambulatory Visit: Payer: BC Managed Care – PPO | Admitting: Family Medicine

## 2021-10-22 ENCOUNTER — Encounter: Payer: Self-pay | Admitting: Family Medicine

## 2021-10-22 VITALS — BP 114/72 | HR 92 | Temp 97.7°F | Ht 66.0 in | Wt 152.2 lb

## 2021-10-22 DIAGNOSIS — M222X1 Patellofemoral disorders, right knee: Secondary | ICD-10-CM | POA: Insufficient documentation

## 2021-10-22 DIAGNOSIS — M7651 Patellar tendinitis, right knee: Secondary | ICD-10-CM | POA: Diagnosis not present

## 2021-10-22 DIAGNOSIS — M2241 Chondromalacia patellae, right knee: Secondary | ICD-10-CM

## 2021-10-22 DIAGNOSIS — F418 Other specified anxiety disorders: Secondary | ICD-10-CM

## 2021-10-22 DIAGNOSIS — N3281 Overactive bladder: Secondary | ICD-10-CM | POA: Diagnosis not present

## 2021-10-22 MED ORDER — MELOXICAM 15 MG PO TABS: 15.0000 mg | ORAL_TABLET | Freq: Every day | ORAL | 0 refills | Status: DC

## 2021-10-22 NOTE — Progress Notes (Signed)
Established Patient Office Visit  Subjective:  Patient ID: Paul Rush, male    DOB: 05-Jul-1991  Age: 31 y.o. MRN: [id number]  CC:  Chief Complaint  Patient presents with   Follow-up    Follow up on medications, patient strained his right knee a few weeks ago still in lots of pain.     HPI Paul Rush presents for follow-up of depression with anxiety, overactive bladder and a recent knee injury.  Developed acute knee pain walking down steps carrying a box of approximately 25 pounds 2 weeks ago.  Denies twisting injury.  His knee locked.  He was unable to bend it for several minutes.  Distant history of trauma to his right leg/knee as a 87-year-old.  He cannot recall any other injury to this knee.  Denies giving way.  Seen at urgent care in [facility].  X-ray was normal with soft tissues swelling over the patellar tendon.  Taking tramadol given at the urgent care in p.m. only.  Continues to do well with venlafaxine.  He is no longer having symptoms of OAB and his discontinued mirabegon.   Past Medical History:  Diagnosis Date   Asthma     Past Surgical History:  Procedure Laterality Date   CHOLECYSTECTOMY     WISDOM TOOTH EXTRACTION     WISDOM TOOTH EXTRACTION      Family History  Problem Relation Age of Onset   Healthy Mother     Social History   Socioeconomic History   Marital status: Married    Spouse name: Not on file   Number of children: Not on file   Years of education: Not on file   Highest education level: Not on file  Occupational History   Not on file  Tobacco Use   Smoking status: Never   Smokeless tobacco: Never  Substance and Sexual Activity   Alcohol use: Yes    Alcohol/week: 2.0 standard drinks    Types: 2 Standard drinks or equivalent per week   Drug use: No   Sexual activity: Yes    Partners: Male  Other Topics Concern   Not on file  Social History Narrative   Drinks 1-2 cups of caffeine daily.   Social Determinants of Health    Financial Resource Strain: Not on file  Food Insecurity: Not on file  Transportation Needs: Not on file  Physical Activity: Not on file  Stress: Not on file  Social Connections: Not on file  Intimate Partner Violence: Not on file    Outpatient Medications Prior to Visit  Medication Sig Dispense Refill   albuterol (VENTOLIN HFA) 108 (90 Base) MCG/ACT inhaler Inhale 2 puffs into the lungs every 6 (six) hours as needed for wheezing or shortness of breath. 8 g 0   fluticasone (FLONASE) 50 MCG/ACT nasal spray Place 2 sprays into both nostrils daily. 16 g 0   ondansetron (ZOFRAN) 4 MG tablet Take 1 tablet (4 mg total) by mouth every 8 (eight) hours as needed for nausea or vomiting. 20 tablet 0   traMADol (ULTRAM) 50 MG tablet Take 50 mg by mouth every 6 (six) hours as needed.     venlafaxine XR (EFFEXOR-XR) 75 MG 24 hr capsule TAKE 1 CAPSULE BY MOUTH EVERY MORNING FOR 1 WEEK, THEN INCREASE TO 2 CAPSULES EVERY MORNING. 60 capsule 1   mirabegron ER (MYRBETRIQ) 25 MG TB24 tablet Take 1 tablet (25 mg total) by mouth daily. 30 tablet 5   meclizine (ANTIVERT) 25 MG tablet  Take 1 tablet (25 mg total) by mouth 3 (three) times daily as needed for dizziness. (Patient not taking: Reported on 10/22/2021) 30 tablet 0   predniSONE (STERAPRED UNI-PAK 21 TAB) 10 MG (21) TBPK tablet Take 6 today, 5 tomorrow, 4 the next day and then 3, 2, 1 and stop 21 tablet 0   No facility-administered medications prior to visit.    No Known Allergies  ROS Review of Systems  Constitutional:  Negative for chills, diaphoresis, fatigue, fever and unexpected weight change.  HENT: Negative.    Respiratory: Negative.    Cardiovascular: Negative.   Gastrointestinal: Negative.   Genitourinary:  Negative for difficulty urinating, frequency and urgency.  Musculoskeletal:  Positive for arthralgias and gait problem. Negative for joint swelling.  Neurological:  Negative for weakness and numbness.     Depression screen [facility] 2/9  10/22/2021 10/22/2021 06/20/2021  Decreased Interest 1 0 3  Down, Depressed, Hopeless 1 0 2  PHQ - 2 Score 2 0 5  Altered sleeping 2 - 3  Tired, decreased energy 2 - 3  Change in appetite 1 - 2  Feeling bad or failure about yourself  0 - 2  Trouble concentrating 0 - 3  Moving slowly or fidgety/restless 2 - 2  Suicidal thoughts 0 - 0  PHQ-9 Score 9 - 20  Difficult doing work/chores Somewhat difficult - Somewhat difficult     Objective:    Physical Exam Vitals and nursing note reviewed.  Constitutional:      General: He is not in acute distress.    Appearance: Normal appearance. He is normal weight. He is not ill-appearing, toxic-appearing or diaphoretic.  HENT:     Head: Normocephalic and atraumatic.     Right Ear: Tympanic membrane, ear canal and external ear normal.     Left Ear: Tympanic membrane, ear canal and external ear normal.     Mouth/Throat:     Mouth: Mucous membranes are moist.     Pharynx: Oropharynx is clear. No oropharyngeal exudate or posterior oropharyngeal erythema.  Eyes:     General: No scleral icterus.       Right eye: No discharge.        Left eye: No discharge.     Extraocular Movements: Extraocular movements intact.     Conjunctiva/sclera: Conjunctivae normal.     Pupils: Pupils are equal, round, and reactive to light.  Cardiovascular:     Rate and Rhythm: Normal rate and regular rhythm.  Pulmonary:     Effort: Pulmonary effort is normal.     Breath sounds: Normal breath sounds.  Abdominal:     General: Bowel sounds are normal.  Musculoskeletal:     Cervical back: No rigidity or tenderness.     Right knee: No swelling, deformity, effusion or erythema. Normal range of motion. Tenderness present over the medial joint line and patellar tendon. No MCL tenderness.     Comments: Positive apprehension test.   Negative grind test.  Lymphadenopathy:     Cervical: No cervical adenopathy.  Skin:    General: Skin is warm and dry.  Neurological:      Mental Status: He is alert and oriented to person, place, and time.  Psychiatric:        Mood and Affect: Mood normal.        Behavior: Behavior normal.    BP 114/72 (BP Location: Right Arm, Patient Position: Sitting, Cuff Size: Normal)    Pulse 92    Temp 97.7 F (36.5 C) (  Temporal)    Ht 5\' 6"  (1.676 m)    Wt 152 lb 3.2 oz (69 kg)    SpO2 97%    BMI 24.57 kg/m  Wt Readings from Last 3 Encounters:  10/22/21 152 lb 3.2 oz (69 kg)  06/20/21 145 lb 12.8 oz (66.1 kg)  12/07/20 153 lb 9.6 oz (69.7 kg)     Health Maintenance Due  Topic Date Due   Hepatitis C Screening  Never done   TETANUS/TDAP  Never done    There are no preventive care reminders to display for this patient.  No results found for: TSH Lab Results  Component Value Date   WBC 5.6 12/13/2018   HGB 15.2 12/13/2018   HCT 44.5 12/13/2018   MCV 89.0 12/13/2018   PLT 205.0 12/13/2018   Lab Results  Component Value Date   NA 139 12/13/2018   K 3.9 12/13/2018   CO2 29 12/13/2018   GLUCOSE 100 (H) 12/13/2018   BUN 14 12/13/2018   CREATININE 0.86 12/13/2018   BILITOT 1.0 12/13/2018   ALKPHOS 60 12/13/2018   AST 22 12/13/2018   ALT 12 12/13/2018   PROT 7.6 12/13/2018   ALBUMIN 5.1 12/13/2018   CALCIUM 9.8 12/13/2018   ANIONGAP 14 04/06/2018   GFR 104.37 12/13/2018   Lab Results  Component Value Date   CHOL 168 12/13/2018   Lab Results  Component Value Date   HDL 43.80 12/13/2018   Lab Results  Component Value Date   LDLCALC 113 (H) 12/13/2018   Lab Results  Component Value Date   TRIG 56.0 12/13/2018   Lab Results  Component Value Date   CHOLHDL 4 12/13/2018   No results found for: HGBA1C    Assessment & Plan:   Problem List Items Addressed This Visit       Musculoskeletal and Integument   Chondromalacia of right patella   Relevant Medications   traMADol (ULTRAM) 50 MG tablet   meloxicam (MOBIC) 15 MG tablet   Other Relevant Orders   Ambulatory referral to Sports Medicine      Genitourinary   OAB (overactive bladder)     Other   Depression with anxiety - Primary   Other Visit Diagnoses     Patellar tendinitis of right knee       Relevant Medications   traMADol (ULTRAM) 50 MG tablet   meloxicam (MOBIC) 15 MG tablet   Other Relevant Orders   Ambulatory referral to Sports Medicine       Meds ordered this encounter  Medications   meloxicam (MOBIC) 15 MG tablet    Sig: Take 1 tablet (15 mg total) by mouth daily.    Dispense:  30 tablet    Refill:  0    Follow-up: Return in about 6 months (around 04/21/2022), or if symptoms worsen or fail to improve.   Continue venlafaxine.  Start meloxicam daily follow-up with sports medicine.  With history of locking, question meniscus injury with medial joint pain but there was a negative grind test. Mliss SaxWilliam Alfred Jeiry Birnbaum, MD

## 2021-10-23 ENCOUNTER — Ambulatory Visit: Payer: Self-pay

## 2021-10-23 ENCOUNTER — Ambulatory Visit: Payer: BC Managed Care – PPO | Admitting: Family Medicine

## 2021-10-23 ENCOUNTER — Encounter: Payer: Self-pay | Admitting: Family Medicine

## 2021-10-23 VITALS — BP 126/80 | Ht 66.0 in | Wt 152.0 lb

## 2021-10-23 DIAGNOSIS — S82141A Displaced bicondylar fracture of right tibia, initial encounter for closed fracture: Secondary | ICD-10-CM | POA: Diagnosis not present

## 2021-10-23 DIAGNOSIS — M25561 Pain in right knee: Secondary | ICD-10-CM

## 2021-10-23 DIAGNOSIS — M25461 Effusion, right knee: Secondary | ICD-10-CM | POA: Insufficient documentation

## 2021-10-23 NOTE — Patient Instructions (Signed)
Nice to meet you Please use ice  Please try the brace and crutches   Please send me a message in MyChart with any questions or updates.  We'll setup a virtual visit once the MRI is resulted.   --Dr. Jordan Likes

## 2021-10-23 NOTE — Assessment & Plan Note (Signed)
Pain started few days ago.  Acutely occurring.  Possible for stress reaction versus fracture given the repetition of going up and down steps. -Counseled on home exercise therapy and supportive care. -Hinged knee brace and crutches. -MRI of the right knee to evaluate for fracture versus loose body versus lack tear of the meniscus.  Possible consideration of surgery.

## 2021-10-23 NOTE — Progress Notes (Signed)
°  Paul Rush - 31 y.o. male MRN [id number]  Date of birth: 1991/01/15  SUBJECTIVE:  Including CC & ROS.  No chief complaint on file.   Paul Rush is a 31 y.o. male that is presenting with acute right knee pain.  He pain started a few days ago.  He noticed that while he was moving furniture in and out of a building.  He had him walk several stairs.  Currently he has had severe pain with any form of weightbearing.  He has mechanical obstruction with flexion.  No history of previous issues of the knee.  Has been taking medications with no improvement.  Review of the note from 1/17 showed he was prescribed ibuprofen. Review of the note from 1/24 shows he was provided meloxicam and tramadol. Review of the knee x-ray from 1/17 shows nonspecific soft tissue swelling.  Review of Systems See HPI   HISTORY: Past Medical, Surgical, Social, and Family History Reviewed & Updated per EMR.   Pertinent Historical Findings include:  Past Medical History:  Diagnosis Date   Asthma     Past Surgical History:  Procedure Laterality Date   CHOLECYSTECTOMY     WISDOM TOOTH EXTRACTION     WISDOM TOOTH EXTRACTION       PHYSICAL EXAM:  VS: BP 126/80 (BP Location: Left Arm, Patient Position: Sitting)    Ht 5\' 6"  (1.676 m)    Wt 152 lb (68.9 kg)    BMI 24.53 kg/m  Physical Exam Gen: NAD, alert, cooperative with exam, well-appearing MSK:  Neurovascularly intact    Limited ultrasound: Right knee:  No effusion in suprapatellar pouch. Normal-appearing quadricep and patellar tendon. No changes of the medial or lateral meniscus  Summary: No structural changes appreciated  Ultrasound and interpretation by , MD    ASSESSMENT & PLAN:   Closed fracture of right tibial plateau Pain started few days ago.  Acutely occurring.  Possible for stress reaction versus fracture given the repetition of going up and down steps. -Counseled on home exercise therapy and supportive  care. -Hinged knee brace and crutches. -MRI of the right knee to evaluate for fracture versus loose body versus lack tear of the meniscus.  Possible consideration of surgery.

## 2021-10-30 ENCOUNTER — Other Ambulatory Visit: Payer: Self-pay | Admitting: Family Medicine

## 2021-10-30 NOTE — Telephone Encounter (Signed)
Patient requesting refill on Tramadol patient was seen at [facility] for right knee pain and prescribed 12 tablets of Tramadol calling to see if Dr. Ethelene Hal would refill this. Please advise.

## 2021-10-31 MED ORDER — TRAMADOL HCL 50 MG PO TABS: 50.0000 mg | ORAL_TABLET | Freq: Four times a day (QID) | ORAL | 0 refills | Status: DC | PRN

## 2021-10-31 NOTE — Telephone Encounter (Signed)
Rx sent in patient aware 

## 2021-11-06 ENCOUNTER — Encounter: Payer: Self-pay | Admitting: Family Medicine

## 2021-11-09 ENCOUNTER — Ambulatory Visit (HOSPITAL_BASED_OUTPATIENT_CLINIC_OR_DEPARTMENT_OTHER)
Admission: RE | Admit: 2021-11-09 | Discharge: 2021-11-09 | Disposition: A | Discharge status: Home / Self Care | Payer: BC Managed Care – PPO | Source: Ambulatory Visit | Attending: Family Medicine | Admitting: Family Medicine

## 2021-11-09 ENCOUNTER — Other Ambulatory Visit: Payer: Self-pay

## 2021-11-09 DIAGNOSIS — S82141A Displaced bicondylar fracture of right tibia, initial encounter for closed fracture: Secondary | ICD-10-CM | POA: Diagnosis present

## 2021-11-11 ENCOUNTER — Other Ambulatory Visit: Payer: Self-pay | Admitting: Family Medicine

## 2021-11-14 ENCOUNTER — Telehealth (INDEPENDENT_AMBULATORY_CARE_PROVIDER_SITE_OTHER): Payer: BC Managed Care – PPO | Admitting: Family Medicine

## 2021-11-14 DIAGNOSIS — M25461 Effusion, right knee: Secondary | ICD-10-CM

## 2021-11-14 MED ORDER — PREDNISONE 5 MG PO TABS: ORAL_TABLET | ORAL | 0 refills | Status: DC

## 2021-11-14 NOTE — Progress Notes (Signed)
Virtual Visit via Video Note  I connected with Unknown Jim on 11/14/21 at  8:00 AM EST by a video enabled telemedicine application and verified that I am speaking with the correct person using two identifiers.  Location: Patient: home Provider: office   I discussed the limitations of evaluation and management by telemedicine and the availability of in person appointments. The patient expressed understanding and agreed to proceed.  History of Present Illness:  Mr. Jensen is a 31 year old male that is following up after his right knee MRI.  This was evident for an trace effusion but otherwise structurally intact.   Observations/Objective:   Assessment and Plan:  Right knee effusion: Still having pain in the knee.  MRI was reassuring with only trace effusion.  Unclear if this is more inflammatory in origin. -Counseled on home exercise therapy and supportive care. -Prednisone and stop meloxicam. -ANA, uric acid, sed rate, and CRP.   Follow Up Instructions:    I discussed the assessment and treatment plan with the patient. The patient was provided an opportunity to ask questions and all were answered. The patient agreed with the plan and demonstrated an understanding of the instructions.   The patient was advised to call back or seek an in-person evaluation if the symptoms worsen or if the condition fails to improve as anticipated.    Clearance Coots, MD

## 2021-11-14 NOTE — Assessment & Plan Note (Signed)
Still having pain in the knee.  MRI was reassuring with only trace effusion.  Unclear if this is more inflammatory in origin. -Counseled on home exercise therapy and supportive care. -Prednisone and stop meloxicam. -ANA, uric acid, sed rate, and CRP.

## 2021-11-19 LAB — URIC ACID: Uric Acid: 5.4 mg/dL (ref 3.8–8.4)

## 2021-11-19 LAB — SEDIMENTATION RATE: Sed Rate: 7 mm/hr (ref 0–15)

## 2021-11-19 LAB — C-REACTIVE PROTEIN: CRP: <1 mg/L (ref 0–10)

## 2021-11-19 LAB — ANA,IFA RA DIAG PNL W/RFLX TIT/PATN
ANA Titer 1: NEGATIVE
Cyclic Citrullin Peptide Ab: 7 units (ref 0–19)
Rheumatoid fact SerPl-aCnc: <10 IU/mL (ref <14.0)

## 2021-11-20 ENCOUNTER — Other Ambulatory Visit: Payer: Self-pay | Admitting: Family Medicine

## 2021-11-20 DIAGNOSIS — M2241 Chondromalacia patellae, right knee: Secondary | ICD-10-CM

## 2021-11-20 DIAGNOSIS — M7651 Patellar tendinitis, right knee: Secondary | ICD-10-CM

## 2021-11-21 ENCOUNTER — Telehealth: Payer: Self-pay | Admitting: Family Medicine

## 2021-11-21 DIAGNOSIS — M25461 Effusion, right knee: Secondary | ICD-10-CM

## 2021-11-21 NOTE — Telephone Encounter (Signed)
Informed of results. Will try genicular nerve block.   Myra Rude, MD [facility] 11/21/2021, 1:55 PM

## 2021-11-22 NOTE — Telephone Encounter (Signed)
Referral, 11/14/21 OV note and MRI faxed to Dr. Maurice Small for genicular nerve ablation.

## 2021-11-26 ENCOUNTER — Encounter: Payer: Self-pay | Admitting: Family Medicine

## 2021-11-26 ENCOUNTER — Ambulatory Visit: Payer: Self-pay

## 2021-11-26 ENCOUNTER — Ambulatory Visit: Payer: BC Managed Care – PPO | Admitting: Family Medicine

## 2021-11-26 VITALS — BP 120/60 | Ht 66.0 in | Wt 152.0 lb

## 2021-11-26 DIAGNOSIS — M222X1 Patellofemoral disorders, right knee: Secondary | ICD-10-CM

## 2021-11-26 NOTE — Patient Instructions (Signed)
Good to see you Please use ice as needed  Please send me a message in MyChart with any questions or updates.  Please see me back pending on your response.   --Dr. Jordan Likes

## 2021-11-26 NOTE — Assessment & Plan Note (Signed)
Still acutely occurring.  Pain is severe in nature.  MRI was revealing for no structural changes. -Counseled on home exercise therapy and supportive care. -Genicular nerve block today.  Preprocedure pain was rated as a 7 out of 10.  Postprocedure pain was rated at 3 out of 10. -Referral to PM&R for consideration of genicular nerve ablation.

## 2021-11-26 NOTE — Progress Notes (Signed)
°  Paul Rush - 31 y.o. male MRN [id number]  Date of birth: Mar 20, 1991  SUBJECTIVE:  Including CC & ROS.  No chief complaint on file.   Paul Rush is a 31 y.o. male that is presenting with ongoing right knee pain.  The pain is centered around the knee itself.  No changes appreciated on MRI.  He reports that his pain is greater than a 7 out of 10.  He has pain with certain movements and at rest   Review of Systems See HPI   HISTORY: Past Medical, Surgical, Social, and Family History Reviewed & Updated per EMR.   Pertinent Historical Findings include:  Past Medical History:  Diagnosis Date   Asthma     Past Surgical History:  Procedure Laterality Date   CHOLECYSTECTOMY     WISDOM TOOTH EXTRACTION     WISDOM TOOTH EXTRACTION       PHYSICAL EXAM:  VS: BP 120/60 (BP Location: Left Arm, Patient Position: Sitting)    Ht 5\' 6"  (1.676 m)    Wt 152 lb (68.9 kg)    BMI 24.53 kg/m  Physical Exam Gen: NAD, alert, cooperative with exam, well-appearing MSK:  Neurovascularly intact    Genicular Nerve Block Procedure Note Paul Rush 04-19-1991  Procedure: Injection Indications: right knee pain   Procedure Details Consent: Risks of procedure as well as the alternatives and risks of each were explained to the (patient/caregiver).  Consent for procedure obtained. Time Out: Verified patient identification, verified procedure, site/side was marked, verified correct patient position, special equipment/implants available, medications/allergies/relevent history reviewed, required imaging and test results available.  Performed.  The area was cleaned with iodine and alcohol swabs.    The right knee superomedial, inferomedial, and superolateral genicular nerves was injected using 2 cc's of 1% lidocaine and 3 cc's of 0.25% bupivacaine with a 25 1 1/2" needle.  Ultrasound was used. Images were obtained in short views showing the injection.     A sterile dressing was  applied.  Patient did tolerate procedure well.     ASSESSMENT & PLAN:   Patellofemoral pain syndrome of right knee Still acutely occurring.  Pain is severe in nature.  MRI was revealing for no structural changes. -Counseled on home exercise therapy and supportive care. -Genicular nerve block today.  Preprocedure pain was rated as a 7 out of 10.  Postprocedure pain was rated at 3 out of 10. -Referral to PM&R for consideration of genicular nerve ablation.

## 2021-12-03 ENCOUNTER — Other Ambulatory Visit: Payer: Self-pay | Admitting: Family Medicine

## 2021-12-03 MED ORDER — GABAPENTIN 300 MG PO CAPS: 300.0000 mg | ORAL_CAPSULE | Freq: Three times a day (TID) | ORAL | 1 refills | Status: DC

## 2021-12-20 ENCOUNTER — Other Ambulatory Visit: Payer: Self-pay | Admitting: Family Medicine

## 2021-12-20 DIAGNOSIS — F418 Other specified anxiety disorders: Secondary | ICD-10-CM

## 2021-12-20 MED ORDER — VENLAFAXINE HCL ER 150 MG PO CP24: 150.0000 mg | ORAL_CAPSULE | Freq: Every day | ORAL | 1 refills | Status: DC

## 2022-01-07 ENCOUNTER — Other Ambulatory Visit: Payer: Self-pay | Admitting: Family Medicine

## 2022-02-19 ENCOUNTER — Ambulatory Visit: Payer: BC Managed Care – PPO | Admitting: Family Medicine

## 2022-02-19 ENCOUNTER — Encounter: Payer: Self-pay | Admitting: Family Medicine

## 2022-02-19 VITALS — BP 116/80 | HR 89 | Temp 97.4°F | Ht 66.0 in | Wt 157.8 lb

## 2022-02-19 DIAGNOSIS — H8309 Labyrinthitis, unspecified ear: Secondary | ICD-10-CM | POA: Diagnosis not present

## 2022-02-19 MED ORDER — MECLIZINE HCL 25 MG PO TABS: 25.0000 mg | ORAL_TABLET | Freq: Three times a day (TID) | ORAL | 0 refills | Status: DC | PRN

## 2022-02-19 NOTE — Progress Notes (Signed)
Established Patient Office Visit  Subjective   Patient ID: Paul Rush, male    DOB: [birth year]  Age: 31 y.o. MRN: [id number]  Chief Complaint  Patient presents with   Dizziness    Dizziness, moments where he starts to sweat symptoms x 4 days. Had ear infection 2 weeks ago.     Dizziness Associated symptoms include coughing. Pertinent negatives include no abdominal pain, myalgias, rash or weakness.  4-day history of a spinning sensation with ongoing steady gait.  He is experienced some diaphoresis and chills.  Denies fevers stuffy nose nasal congestion ear congestion.  There has been some postnasal drip and dry cough.  Status post ear infection 2 weeks ago.  Ears are doing better.  History of otitis media as a child.    Review of Systems  Constitutional: Negative.   HENT: Negative.  Negative for ear discharge, ear pain, hearing loss and tinnitus.   Eyes:  Negative for blurred vision, discharge and redness.  Respiratory:  Positive for cough and wheezing. Negative for sputum production.   Cardiovascular: Negative.   Gastrointestinal:  Negative for abdominal pain.  Genitourinary: Negative.   Musculoskeletal: Negative.  Negative for myalgias.  Skin:  Negative for rash.  Neurological:  Positive for dizziness. Negative for tingling, loss of consciousness and weakness.  Endo/Heme/Allergies:  Negative for polydipsia.     Objective:     BP 116/80 (BP Location: Right Arm, Patient Position: Sitting, Cuff Size: Normal)   Pulse 89   Temp (!) 97.4 F (36.3 C) (Temporal)   Ht 5\' 6"  (1.676 m)   Wt 157 lb 12.8 oz (71.6 kg)   SpO2 97%   BMI 25.47 kg/m    Physical Exam Constitutional:      General: He is not in acute distress.    Appearance: Normal appearance. He is not ill-appearing, toxic-appearing or diaphoretic.  HENT:     Head: Normocephalic and atraumatic.     Right Ear: Tympanic membrane, ear canal and external ear normal.     Left Ear: Tympanic membrane, ear canal and  external ear normal.     Mouth/Throat:     Mouth: Mucous membranes are moist.     Pharynx: Oropharynx is clear. No oropharyngeal exudate or posterior oropharyngeal erythema.  Eyes:     General: No scleral icterus.       Right eye: No discharge.        Left eye: No discharge.     Extraocular Movements: Extraocular movements intact.     Conjunctiva/sclera: Conjunctivae normal.     Pupils: Pupils are equal, round, and reactive to light.  Cardiovascular:     Rate and Rhythm: Normal rate and regular rhythm.  Pulmonary:     Effort: Pulmonary effort is normal. No respiratory distress.     Breath sounds: Normal breath sounds.  Abdominal:     General: Bowel sounds are normal.  Musculoskeletal:     Cervical back: No rigidity or tenderness.  Skin:    General: Skin is warm and dry.  Neurological:     Mental Status: He is alert and oriented to person, place, and time.  Psychiatric:        Mood and Affect: Mood normal.        Behavior: Behavior normal.     No results found for any visits on 02/19/22.    The ASCVD Risk score (Arnett DK, et al., 2019) failed to calculate for the following reasons:   The 2019 ASCVD risk score is  only valid for ages 17 to 67    Assessment & Plan:   Problem List Items Addressed This Visit       Nervous and Auditory   Labyrinthitis - Primary   Relevant Medications   meclizine (ANTIVERT) 25 MG tablet    Return if symptoms worsen or fail to improve.  Information was given on labyrinthitis.  Meclizine as needed.  Follow-up if not improving within the month.  Libby Maw, MD

## 2022-04-21 ENCOUNTER — Ambulatory Visit: Payer: BC Managed Care – PPO | Admitting: Family Medicine

## 2022-04-28 ENCOUNTER — Ambulatory Visit: Payer: BC Managed Care – PPO | Admitting: Family Medicine

## 2022-04-28 ENCOUNTER — Encounter: Payer: Self-pay | Admitting: Family Medicine

## 2022-04-28 VITALS — BP 110/78 | HR 89 | Temp 97.5°F | Ht 66.0 in | Wt 166.5 lb

## 2022-04-28 DIAGNOSIS — T50905A Adverse effect of unspecified drugs, medicaments and biological substances, initial encounter: Secondary | ICD-10-CM

## 2022-04-28 DIAGNOSIS — F418 Other specified anxiety disorders: Secondary | ICD-10-CM

## 2022-04-28 DIAGNOSIS — K117 Disturbances of salivary secretion: Secondary | ICD-10-CM | POA: Diagnosis not present

## 2022-04-28 MED ORDER — VENLAFAXINE HCL ER 150 MG PO CP24: 150.0000 mg | ORAL_CAPSULE | Freq: Every day | ORAL | 2 refills | Status: DC

## 2022-04-28 NOTE — Progress Notes (Signed)
Established Patient Office Visit  Subjective   Patient ID: Paul Rush, male    DOB: 03/15/1991  Age: 31 y.o. MRN: [id number]  Chief Complaint  Patient presents with   Follow-up    6 month follow up, states that throat always seems to be very dry with a little cough that come and go.     HPI continues to do well with the 115 mg of Effexor XR.  Symptoms are largely relieved.  Feels as though anxiety and depression are under better control.  Has developed dry mouth over the last month and a half.  Denies postnasal drip reflux or recent asthma symptoms.  He did have a history of asthma as a child but outgrew it.  He is not bothered as an adult.  He does not smoke.    Review of Systems  Constitutional: Negative.   HENT: Negative.    Eyes:  Negative for blurred vision, discharge and redness.  Respiratory:  Positive for cough. Negative for hemoptysis, sputum production, shortness of breath and wheezing.   Cardiovascular: Negative.   Gastrointestinal:  Negative for abdominal pain and heartburn.  Genitourinary: Negative.   Musculoskeletal: Negative.  Negative for myalgias.  Skin:  Negative for rash.  Neurological:  Negative for tingling, loss of consciousness and weakness.  Endo/Heme/Allergies:  Negative for polydipsia.         04/28/2022    8:30 AM 04/28/2022    8:09 AM 02/19/2022    8:26 AM  Depression screen PHQ 2/9  Decreased Interest 1 0 0  Down, Depressed, Hopeless 1 0 0  PHQ - 2 Score 2 0 0  Altered sleeping 2    Tired, decreased energy 2    Change in appetite 2    Feeling bad or failure about yourself  2    Trouble concentrating 0    Moving slowly or fidgety/restless 1    Suicidal thoughts 0    PHQ-9 Score 11       Objective:     BP 110/78 (BP Location: Right Arm, Patient Position: Sitting, Cuff Size: Normal)   Pulse 89   Temp (!) 97.5 F (36.4 C) (Temporal)   Ht 5\' 6"  (1.676 m)   Wt 166 lb 8 oz (75.5 kg)   SpO2 97%   BMI 26.87 kg/m    Physical  Exam Constitutional:      General: He is not in acute distress.    Appearance: Normal appearance. He is not ill-appearing, toxic-appearing or diaphoretic.  HENT:     Head: Normocephalic and atraumatic.     Right Ear: External ear normal.     Left Ear: External ear normal.     Mouth/Throat:     Mouth: Mucous membranes are moist.     Pharynx: Oropharynx is clear. No oropharyngeal exudate or posterior oropharyngeal erythema.  Eyes:     General: No scleral icterus.       Right eye: No discharge.        Left eye: No discharge.     Extraocular Movements: Extraocular movements intact.     Conjunctiva/sclera: Conjunctivae normal.     Pupils: Pupils are equal, round, and reactive to light.  Cardiovascular:     Rate and Rhythm: Normal rate and regular rhythm.  Pulmonary:     Effort: Pulmonary effort is normal. No respiratory distress.     Breath sounds: Normal breath sounds.  Musculoskeletal:     Cervical back: No rigidity or tenderness.  Skin:  General: Skin is warm and dry.  Neurological:     Mental Status: He is alert and oriented to person, place, and time.  Psychiatric:        Mood and Affect: Mood normal.        Behavior: Behavior normal.      No results found for any visits on 04/28/22.    The ASCVD Risk score (Arnett DK, et al., 2019) failed to calculate for the following reasons:   The 2019 ASCVD risk score is only valid for ages 33 to 60    Assessment & Plan:   Problem List Items Addressed This Visit       Digestive   Drug-induced xerostomia     Other   Depression with anxiety - Primary   Relevant Medications   venlafaxine XR (EFFEXOR XR) 150 MG 24 hr capsule    Return in about 6 months (around 10/29/2022), or try Biotene mouth wash..  Would like to continue venlafaxine because it is helping.  We will try Biotene moisturizing mouthwash.  Follow-up in 6 months or sooner if the dry mouth worsens or is not relieved by the Biotene.  We will make a  change.  Mliss Sax, MD

## 2022-05-05 ENCOUNTER — Telehealth: Payer: Self-pay

## 2022-05-05 ENCOUNTER — Ambulatory Visit
Admission: RE | Admit: 2022-05-05 | Discharge: 2022-05-05 | Disposition: A | Discharge status: Home / Self Care | Payer: BC Managed Care – PPO | Source: Ambulatory Visit | Attending: Family Medicine | Admitting: Family Medicine

## 2022-05-05 VITALS — BP 121/82 | HR 88 | Temp 97.8°F | Resp 18

## 2022-05-05 DIAGNOSIS — R5383 Other fatigue: Secondary | ICD-10-CM | POA: Diagnosis present

## 2022-05-05 DIAGNOSIS — R350 Frequency of micturition: Secondary | ICD-10-CM | POA: Insufficient documentation

## 2022-05-05 LAB — POCT URINALYSIS DIP (MANUAL ENTRY)
Bilirubin, UA: NEGATIVE
Glucose, UA: NEGATIVE mg/dL
Ketones, POC UA: NEGATIVE mg/dL
Leukocytes, UA: NEGATIVE
Nitrite, UA: NEGATIVE
Protein Ur, POC: NEGATIVE mg/dL
Spec Grav, UA: 1.025 (ref 1.010–1.025)
Urobilinogen, UA: 0.2 E.U./dL
pH, UA: 5.5 (ref 5.0–8.0)

## 2022-05-05 NOTE — Telephone Encounter (Signed)
Per triage nurse, pt stated he was experiencing chest pain, numbness/tingling in left arm with hot flashes. Based on symptoms given by nurse and pt needing to be seen within 4 hours, pt was advised to seek medical attention at an urgent care or the ER. Triage nurse will inform patient. Sw, cma

## 2022-05-05 NOTE — ED Triage Notes (Addendum)
The patient states there has been a tingling sensation down right arm, headache, dizziness, frequent urination, hot flashes, ringing in right ear and RLQ abd pain (intermittent).   Started: last Tuesday   Home interventions: tylenol around 3:30pm today

## 2022-05-05 NOTE — ED Provider Notes (Signed)
[facility]    CSN: [id number] Arrival date & time: 05/05/22  1749      History   Chief Complaint Chief Complaint  Patient presents with   Nausea    Have been having issues with Throat, was referred from Dr. Ma Hillock office to visit Urgent care for symptoms instead of waiting for appointment on Thursday. - Entered by patient   Dizziness   Urinary Frequency   Tingling    HPI Paul Rush is a 31 y.o. male.    Dizziness Urinary Frequency   Here for 33-month history of dryness in his throat and some nausea.  He has also had some dizziness and feeling off balance that is developed in the last month or so.  That is intermittent.  He also has intermittent right hand numbness and arm numbness that can last as long as 30 minutes.  He will get sweats in the back of his neck and that is been for the last 2 or 3 months.  For much longer than the symptoms he has had urinary frequency.  No hematuria or dysuria.    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 Authorizing Provider  albuterol (VENTOLIN HFA) 108 (90 Base) MCG/ACT inhaler Inhale 2 puffs into the lungs every 6 (six) hours as needed for wheezing or shortness of breath. 04/29/21   Junie Spencer, FNP  fluticasone (FLONASE) 50 MCG/ACT nasal spray Place 2 sprays into both nostrils daily. 04/24/21   Waldon Merl, PA-C  meclizine (ANTIVERT) 25 MG tablet Take 1 tablet (25 mg total) by mouth 3 (three) times daily as needed for dizziness. 02/19/22   Mliss Sax, MD  venlafaxine XR (EFFEXOR XR) 150 MG 24 hr capsule Take 1 capsule (150 mg total) by mouth daily with breakfast. 04/28/22   Mliss Sax, MD    Family History Family History  Problem Relation Age of Onset   Healthy Mother     Social History Social History   Tobacco Use   Smoking status: Never   Smokeless tobacco: Never  Substance Use Topics   Alcohol use: Yes    Alcohol/week: 2.0 standard drinks of alcohol    Types: 2 Standard drinks or equivalent per week   Drug use: No     Allergies   Patient has no known allergies.   Review of Systems Review of Systems  Genitourinary:  Positive for frequency.  Neurological:  Positive for dizziness.     Physical Exam Triage Vital Signs ED Triage Vitals  Enc Vitals Group     BP 05/05/22 1813 121/82     Pulse Rate 05/05/22 1813 88     Resp 05/05/22 1813 18     Temp 05/05/22 1813 97.8 F (36.6 C)     Temp Source 05/05/22 1813 Oral  SpO2 05/05/22 1813 96 %     Weight --      Height --      Head Circumference --      Peak Flow --      Pain Score 05/05/22 1814 5     Pain Loc --      Pain Edu? --      Excl. in GC? --    No data found.  Updated Vital Signs BP 121/82 (BP Location: Right Arm)   Pulse 88   Temp 97.8 F (36.6 C) (Oral)   Resp 18   SpO2 96%   Visual Acuity Right Eye Distance:   Left Eye Distance:   Bilateral Distance:    Right Eye Near:   Left Eye Near:    Bilateral Near:     Physical Exam Vitals reviewed.  Constitutional:      General: He is not in acute distress.    Appearance: He is not ill-appearing, toxic-appearing or diaphoretic.  HENT:     Nose: Nose normal.     Mouth/Throat:     Mouth: Mucous membranes are moist.     Pharynx: No oropharyngeal exudate or posterior oropharyngeal erythema.  Eyes:      Extraocular Movements: Extraocular movements intact.     Conjunctiva/sclera: Conjunctivae normal.     Pupils: Pupils are equal, round, and reactive to light.  Cardiovascular:     Rate and Rhythm: Normal rate and regular rhythm.     Heart sounds: No murmur heard. Pulmonary:     Effort: Pulmonary effort is normal.     Breath sounds: No stridor. No wheezing, rhonchi or rales.  Abdominal:     Palpations: Abdomen is soft.     Tenderness: There is no abdominal tenderness.  Musculoskeletal:     Cervical back: Neck supple.     Right lower leg: No edema.     Left lower leg: No edema.  Lymphadenopathy:     Cervical: No cervical adenopathy.  Skin:    Capillary Refill: Capillary refill takes less than 2 seconds.     Coloration: Skin is not jaundiced or pale.  Neurological:     General: No focal deficit present.     Mental Status: He is alert and oriented to person, place, and time.  Psychiatric:        Behavior: Behavior normal.      UC Treatments / Results  Labs (all labs ordered are listed, but only abnormal results are displayed) Labs Reviewed  URINE CULTURE  CBC WITH DIFFERENTIAL/PLATELET  COMPREHENSIVE METABOLIC PANEL  TSH  LACTATE DEHYDROGENASE  VITAMIN B12  POCT URINALYSIS DIP (MANUAL ENTRY)    EKG   Radiology No results found.  Procedures Procedures (including critical care time)  Medications Ordered in UC Medications - No data to display  Initial Impression / Assessment and Plan / UC Course  I have reviewed the triage vital signs and the nursing notes.  Pertinent labs & imaging results that were available during my care of the patient were reviewed by me and considered in my medical decision making (see chart for details).     Lab work done to address his symptoms.  If he has any persistent numbness in his right hand and arm longer than he has had before or severe headache, he is to proceed to the emergency room on urgent basis.  He does have follow-up  with his PCP in 3 days  Staff will him if anything is significantly abnormal on the  lab work Final Clinical Impressions(s) / UC Diagnoses   Final diagnoses:  Fatigue, unspecified type  Urinary frequency     Discharge Instructions      We have drawn blood today for a blood count, chemistries, thyroid, and to check for urine infection.  Staff will call you if there is anything significantly abnormal     ED Prescriptions   None    PDMP not reviewed this encounter.   Zenia Resides, MD 05/05/22 [phone]

## 2022-05-05 NOTE — Discharge Instructions (Addendum)
We have drawn blood today for a blood count, chemistries, thyroid, and to check for urine infection.  Staff will call you if there is anything significantly abnormal

## 2022-05-07 LAB — CBC WITH DIFFERENTIAL/PLATELET
Basophils Absolute: 0 10*3/uL (ref 0.0–0.2)
Basos: 0 %
EOS (ABSOLUTE): 0.1 10*3/uL (ref 0.0–0.4)
Eos: 1 %
Hematocrit: 47.7 % (ref 37.5–51.0)
Hemoglobin: 16.1 g/dL (ref 13.0–17.7)
Immature Grans (Abs): 0 10*3/uL (ref 0.0–0.1)
Immature Granulocytes: 0 %
Lymphocytes Absolute: 3.2 10*3/uL — ABNORMAL HIGH (ref 0.7–3.1)
Lymphs: 42 %
MCH: 31.4 pg (ref 26.6–33.0)
MCHC: 33.8 g/dL (ref 31.5–35.7)
MCV: 93 fL (ref 79–97)
Monocytes Absolute: 0.4 10*3/uL (ref 0.1–0.9)
Monocytes: 5 %
Neutrophils Absolute: 4 10*3/uL (ref 1.4–7.0)
Neutrophils: 52 %
Platelets: 259 10*3/uL (ref 150–450)
RBC: 5.13 x10E6/uL (ref 4.14–5.80)
RDW: 13 % (ref 11.6–15.4)
WBC: 7.6 10*3/uL (ref 3.4–10.8)

## 2022-05-07 LAB — COMPREHENSIVE METABOLIC PANEL
ALT: 19 IU/L (ref 0–44)
AST: 19 IU/L (ref 0–40)
Albumin/Globulin Ratio: 2.5 — ABNORMAL HIGH (ref 1.2–2.2)
Albumin: 5.4 g/dL — ABNORMAL HIGH (ref 4.1–5.1)
Alkaline Phosphatase: 86 IU/L (ref 44–121)
BUN/Creatinine Ratio: 13 (ref 9–20)
BUN: 12 mg/dL (ref 6–20)
Bilirubin Total: 0.6 mg/dL (ref 0.0–1.2)
CO2: 21 mmol/L (ref 20–29)
Calcium: 10 mg/dL (ref 8.7–10.2)
Chloride: 101 mmol/L (ref 96–106)
Creatinine, Ser: 0.89 mg/dL (ref 0.76–1.27)
Globulin, Total: 2.2 g/dL (ref 1.5–4.5)
Glucose: 88 mg/dL (ref 70–99)
Potassium: 4.2 mmol/L (ref 3.5–5.2)
Sodium: 142 mmol/L (ref 134–144)
Total Protein: 7.6 g/dL (ref 6.0–8.5)
eGFR: 116 mL/min/{1.73_m2} (ref >59)

## 2022-05-07 LAB — URINE CULTURE: Culture: NO GROWTH

## 2022-05-07 LAB — TSH: TSH: 3.52 u[IU]/mL (ref 0.450–4.500)

## 2022-05-07 LAB — VITAMIN B12: Vitamin B-12: 176 pg/mL — ABNORMAL LOW (ref 232–1245)

## 2022-05-07 LAB — LACTATE DEHYDROGENASE: LDH: 179 IU/L (ref 121–224)

## 2022-05-08 ENCOUNTER — Ambulatory Visit: Payer: BC Managed Care – PPO | Admitting: Family Medicine

## 2022-05-08 ENCOUNTER — Encounter: Payer: Self-pay | Admitting: Family Medicine

## 2022-05-08 VITALS — BP 112/72 | HR 103 | Temp 97.4°F | Ht 66.0 in | Wt 162.0 lb

## 2022-05-08 DIAGNOSIS — H8309 Labyrinthitis, unspecified ear: Secondary | ICD-10-CM | POA: Diagnosis not present

## 2022-05-08 DIAGNOSIS — G4459 Other complicated headache syndrome: Secondary | ICD-10-CM | POA: Diagnosis not present

## 2022-05-08 DIAGNOSIS — E538 Deficiency of other specified B group vitamins: Secondary | ICD-10-CM

## 2022-05-08 DIAGNOSIS — R202 Paresthesia of skin: Secondary | ICD-10-CM

## 2022-05-08 DIAGNOSIS — J029 Acute pharyngitis, unspecified: Secondary | ICD-10-CM | POA: Diagnosis not present

## 2022-05-08 DIAGNOSIS — H539 Unspecified visual disturbance: Secondary | ICD-10-CM | POA: Insufficient documentation

## 2022-05-08 DIAGNOSIS — F418 Other specified anxiety disorders: Secondary | ICD-10-CM

## 2022-05-08 LAB — VITAMIN B12: Vitamin B-12: 115 pg/mL — ABNORMAL LOW (ref 211–911)

## 2022-05-08 LAB — SEDIMENTATION RATE: Sed Rate: 7 mm/hr (ref 0–15)

## 2022-05-08 MED ORDER — VENLAFAXINE HCL ER 37.5 MG PO CP24: ORAL_CAPSULE | ORAL | 0 refills | Status: DC

## 2022-05-08 MED ORDER — ESCITALOPRAM OXALATE 5 MG PO TABS: ORAL_TABLET | ORAL | 0 refills | Status: DC

## 2022-05-08 NOTE — Progress Notes (Addendum)
Established Patient Office Visit  Subjective   Patient ID: Paul Rush, male    DOB: 06/22/1991  Age: 31 y.o. MRN: [id number]  Chief Complaint  Patient presents with   Sore Throat    Sore throat becoming worse, tingling in left hand, hot flashes, feeling nauseous again. Patient fasting. No appetite.     Sore Throat  Associated symptoms include headaches. Pertinent negatives include no abdominal pain, coughing or shortness of breath.   ongoing history of bilateral temporal headaches associated with paroxysmal loss of vision, hot flashes moving from his neck down his spine, paresthesias in his right arm and labyrinthitis spinning sensation and unsteady gait.  He does have a headache history.  Ongoing dry mouth.  There have been no fever chills cough to speak of.  Dry mouth persists with venlafaxine.  He would like to make a change.    Review of Systems  Constitutional: Negative.   HENT:  Positive for sore throat.   Eyes:  Positive for blurred vision. Negative for double vision, discharge and redness.  Respiratory: Negative.  Negative for cough, sputum production, shortness of breath and wheezing.   Cardiovascular: Negative.   Gastrointestinal:  Negative for abdominal pain.  Genitourinary: Negative.   Musculoskeletal: Negative.  Negative for myalgias.  Skin:  Negative for rash.  Neurological:  Positive for dizziness, tingling and headaches. Negative for loss of consciousness and weakness.  Endo/Heme/Allergies:  Negative for polydipsia.  Psychiatric/Behavioral:  Positive for depression.       Objective:     BP 112/72 (BP Location: Right Arm, Patient Position: Sitting, Cuff Size: Normal)   Pulse (!) 103   Temp (!) 97.4 F (36.3 C) (Temporal)   Ht 5\' 6"  (1.676 m)   Wt 162 lb (73.5 kg)   SpO2 94%   BMI 26.15 kg/m    Physical Exam Constitutional:      General: He is not in acute distress.    Appearance: Normal appearance. He is not ill-appearing, toxic-appearing or  diaphoretic.  HENT:     Head: Normocephalic and atraumatic.     Right Ear: Tympanic membrane, ear canal and external ear normal.     Left Ear: Tympanic membrane, ear canal and external ear normal.     Mouth/Throat:     Mouth: Mucous membranes are moist.     Pharynx: Oropharynx is clear. Uvula midline. No oropharyngeal exudate or posterior oropharyngeal erythema.     Tonsils: No tonsillar exudate or tonsillar abscesses. 0 on the right. 0 on the left.  Eyes:     General: No visual field deficit or scleral icterus.       Right eye: No discharge.        Left eye: No discharge.     Extraocular Movements: Extraocular movements intact.     Conjunctiva/sclera: Conjunctivae normal.     Pupils: Pupils are equal, round, and reactive to light.  Neck:     Thyroid: No thyromegaly.  Cardiovascular:     Rate and Rhythm: Normal rate and regular rhythm.  Pulmonary:     Effort: Pulmonary effort is normal. No respiratory distress.     Breath sounds: Normal breath sounds.  Abdominal:     General: Bowel sounds are normal.     Tenderness: There is no abdominal tenderness. There is no guarding.  Musculoskeletal:     Cervical back: Normal, normal range of motion and neck supple. No rigidity or tenderness. Normal range of motion.     Thoracic back: Normal. Normal  range of motion.     Lumbar back: Normal. Normal range of motion.  Lymphadenopathy:     Cervical: No cervical adenopathy.  Skin:    General: Skin is warm and dry.  Neurological:     Mental Status: He is alert and oriented to person, place, and time.     Cranial Nerves: No cranial nerve deficit, dysarthria or facial asymmetry.     Motor: No weakness or tremor.  Psychiatric:        Mood and Affect: Mood normal.        Behavior: Behavior normal.      Results for orders placed or performed in visit on 05/08/22  HIV Antibody (routine testing w rflx)  Result Value Ref Range   HIV 1&2 Ab, 4th Generation NON-REACTIVE NON-REACTIVE   Sedimentation rate  Result Value Ref Range   Sed Rate 7 0 - 15 mm/hr  Vitamin B12  Result Value Ref Range   Vitamin B-12 115 (L) 211 - 911 pg/mL      The ASCVD Risk score (Arnett DK, et al., 2019) failed to calculate for the following reasons:   The 2019 ASCVD risk score is only valid for ages 72 to 66    Assessment & Plan:   Problem List Items Addressed This Visit       Nervous and Auditory   Labyrinthitis     Other   Depression with anxiety   Relevant Medications   venlafaxine XR (EFFEXOR XR) 37.5 MG 24 hr capsule   escitalopram (LEXAPRO) 5 MG tablet   B12 deficiency   Relevant Medications   cyanocobalamin (VITAMIN B12) 1000 MCG tablet   Other Relevant Orders   Vitamin B12 (Completed)   Paresthesias   Visual changes   Other complicated headache syndrome - Primary   Relevant Medications   venlafaxine XR (EFFEXOR XR) 37.5 MG 24 hr capsule   escitalopram (LEXAPRO) 5 MG tablet   Other Relevant Orders   MR BRAIN W WO CONTRAST   MR BRAIN WO CONTRAST   Other Visit Diagnoses     Pharyngitis, unspecified etiology       Relevant Orders   Upper Respiratory Culture   HIV Antibody (routine testing w rflx) (Completed)   RPR   Sedimentation rate (Completed)       Return in about 6 weeks (around 06/19/2022).  Will send for MRI scan secondary to headache with focal neurological signs.  Will taper off of venlafaxine and start Lexapro.  Recheck B12.  I spent over 45 minutes with this patient in history taking exam and formulating a plan.  Mliss Sax, MD

## 2022-05-09 ENCOUNTER — Encounter: Payer: Self-pay | Admitting: Family Medicine

## 2022-05-09 MED ORDER — VITAMIN B-12 1000 MCG PO TABS: 1000.0000 ug | ORAL_TABLET | Freq: Every day | ORAL | 2 refills | Status: AC

## 2022-05-09 NOTE — Addendum Note (Signed)
Addended by: Andrez Grime on: 05/09/2022 12:28 PM   Modules accepted: Orders

## 2022-05-13 LAB — HIV ANTIBODY (ROUTINE TESTING W REFLEX): HIV 1&2 Ab, 4th Generation: NONREACTIVE

## 2022-05-13 LAB — CULTURE, UPPER RESPIRATORY

## 2022-05-13 LAB — RPR: RPR Ser Ql: NONREACTIVE

## 2022-05-17 ENCOUNTER — Ambulatory Visit (HOSPITAL_BASED_OUTPATIENT_CLINIC_OR_DEPARTMENT_OTHER): Payer: BC Managed Care – PPO

## 2022-05-24 ENCOUNTER — Ambulatory Visit (HOSPITAL_BASED_OUTPATIENT_CLINIC_OR_DEPARTMENT_OTHER)
Admission: RE | Admit: 2022-05-24 | Discharge: 2022-05-24 | Disposition: A | Discharge status: Home / Self Care | Payer: BC Managed Care – PPO | Source: Ambulatory Visit | Attending: Family Medicine | Admitting: Family Medicine

## 2022-05-24 DIAGNOSIS — G4459 Other complicated headache syndrome: Secondary | ICD-10-CM | POA: Diagnosis present

## 2022-05-24 MED ORDER — GADOBUTROL 1 MMOL/ML IV SOLN
7.5000 mL | Freq: Once | INTRAVENOUS | Status: AC | PRN
  Administered 2022-05-24: 7.5 mL via INTRAVENOUS

## 2022-08-05 IMAGING — MR MR KNEE*R* W/O CM
7 series · 40 of 40 positions shown · non-contrast
Comparison: X-ray 10/15/2021

CLINICAL DATA: Right knee pain for 1 month

EXAM:
MRI OF THE RIGHT KNEE WITHOUT CONTRAST
TECHNIQUE: Multiplanar, multisequence MR imaging of the knee was performed. No
intravenous contrast was administered.

[Series 3: T2 fat-sat · axial · 4.0mm · 0.62mm/px · z∈[-81,+39]mm · 7 of 25 slices shown (1 of 3)]
[im 1/25]
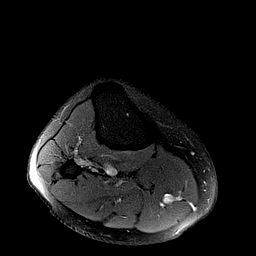
[im 5/25]
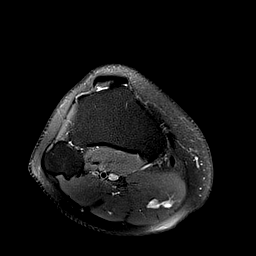
[im 9/25]
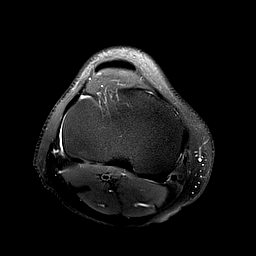
[im 13/25]
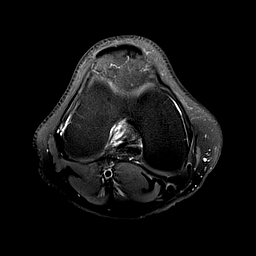
[im 17/25]
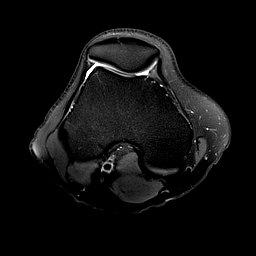
[im 21/25]
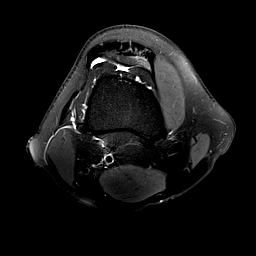
[im 25/25]
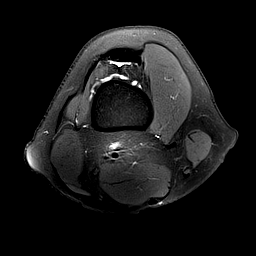

[Series 4: T1 · coronal · 4.0mm · 0.59mm/px · 6 of 21 slices shown]
[im 1/21]
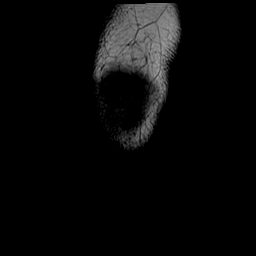
[im 5/21]
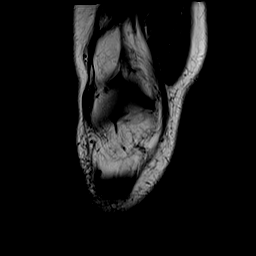
[im 9/21]
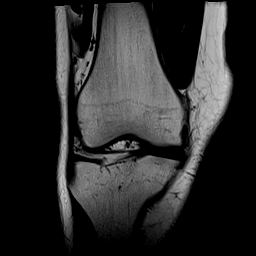
[im 13/21]
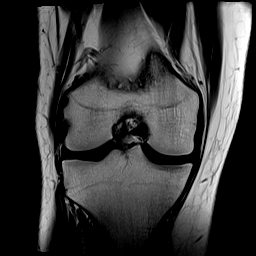
[im 17/21]
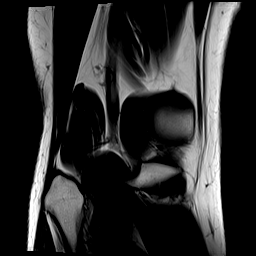
[im 21/21]
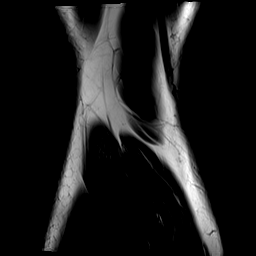

[Series 5: PD fat-sat · coronal · 4.0mm · 0.59mm/px · 6 of 21 slices shown (1 of 2)]
[im 1/21]
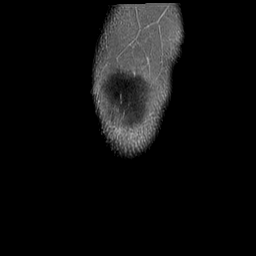
[im 5/21]
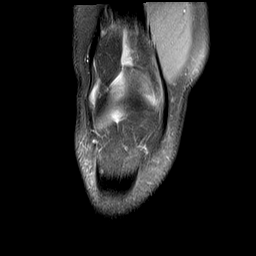
[im 9/21]
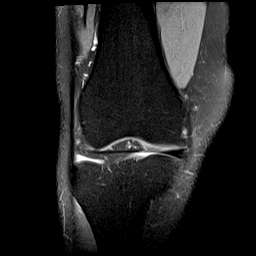
[im 13/21]
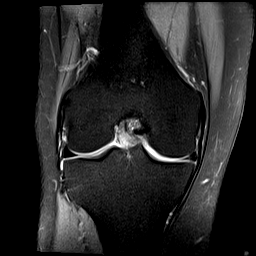
[im 17/21]
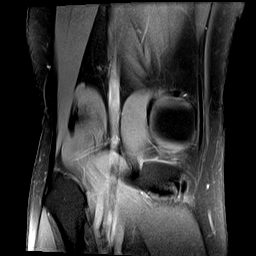
[im 21/21]
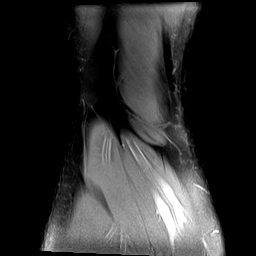

[Series 6: T2 fat-sat · coronal · 4.0mm · 0.59mm/px · 6 of 21 slices shown (2 of 3)]
[im 1/21]
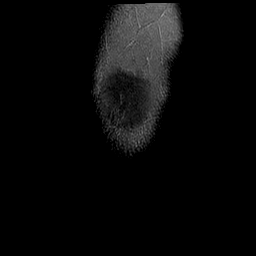
[im 5/21]
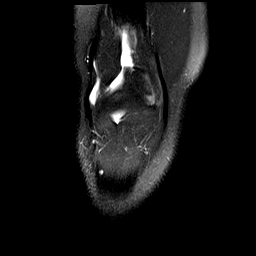
[im 9/21]
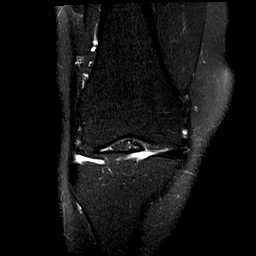
[im 13/21]
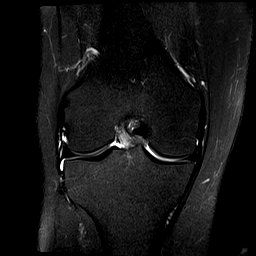
[im 17/21]
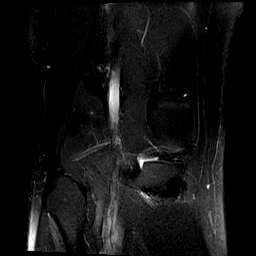
[im 21/21]
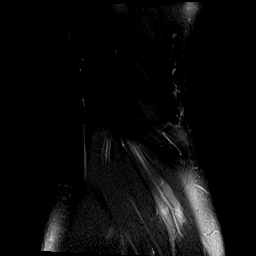

[Series 7: PD fat-sat · sagittal · 3.0mm · 0.59mm/px · 7 of 22 slices shown (2 of 2)]
[im 1/22]
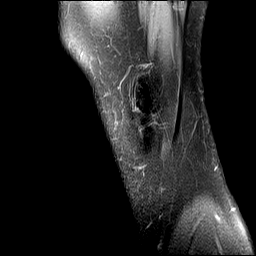
[im 4/22]
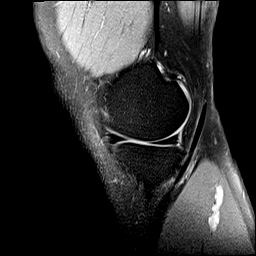
[im 8/22]
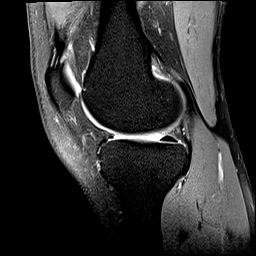
[im 11/22]
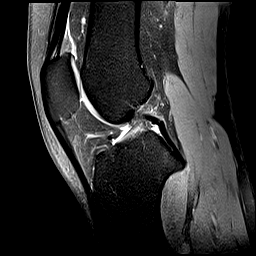
[im 15/22]
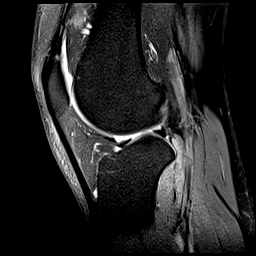
[im 18/22]
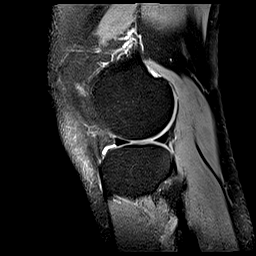
[im 22/22]
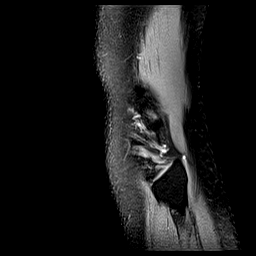

[Series 8: T2 fat-sat · sagittal · 3.0mm · 0.59mm/px · 7 of 22 slices shown (3 of 3)]
[im 1/22]
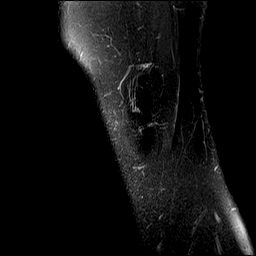
[im 4/22]
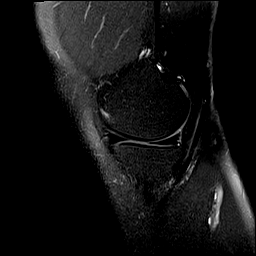
[im 8/22]
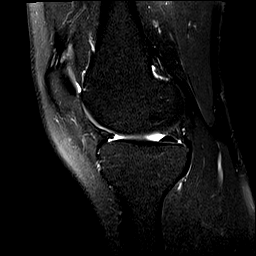
[im 11/22]
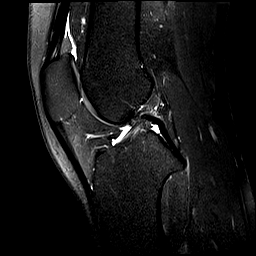
[im 15/22]
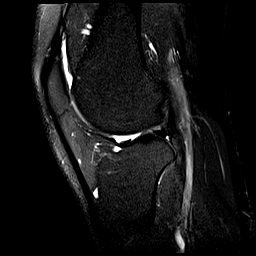
[im 18/22]
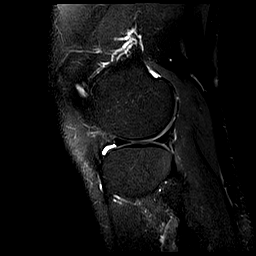
[im 22/22]
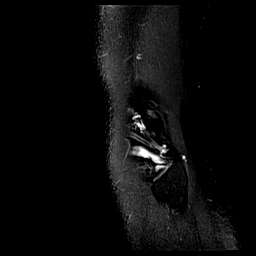

[Series 100: hx · axial · 8.0mm · 0.68mm/px · 1 of 1 slices shown]
[im 1/1]
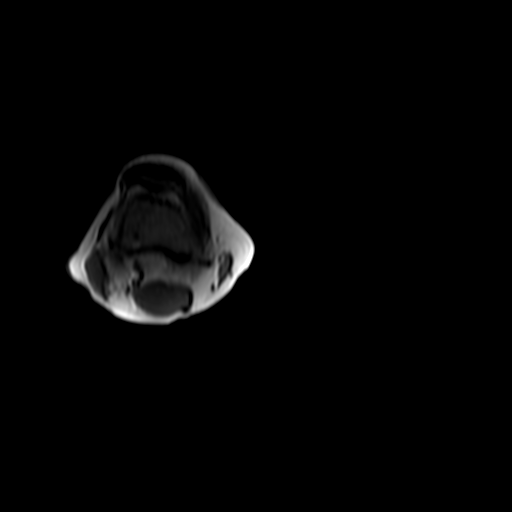

[40 of 40 positions shown; findings below may reference images not displayed]

FINDINGS: MENISCI

Medial meniscus:  Intact.

Lateral meniscus:  Intact.

LIGAMENTS

Cruciates: Intact ACL and PCL.

Collaterals: Intact MCL. Lateral collateral ligament complex intact.

CARTILAGE

Patellofemoral:  No chondral defect.

Medial:  No chondral defect.

Lateral:  No chondral defect.

MISCELLANEOUS

Joint:  Trace joint effusion. Fat pads within normal limits.

Popliteal Fossa:  No Baker's cyst. Intact popliteus tendon.

Extensor Mechanism:  Intact quadriceps and patellar tendons.

Bones: No acute fracture. No dislocation. No bone marrow edema. No
marrow replacing bone lesion.

Other: No significant periarticular soft tissue findings. Normal
muscle bulk and signal intensity.
IMPRESSION: 1. No internal derangement of the right knee.
2. Trace joint effusion.

## 2022-10-07 ENCOUNTER — Other Ambulatory Visit: Payer: Self-pay | Admitting: Family Medicine

## 2022-10-07 DIAGNOSIS — F418 Other specified anxiety disorders: Secondary | ICD-10-CM

## 2022-10-09 MED ORDER — VENLAFAXINE HCL ER 37.5 MG PO CP24: ORAL_CAPSULE | ORAL | 0 refills | Status: DC

## 2022-10-09 NOTE — Telephone Encounter (Signed)
Chart supports Rx Last OV: 04/2022 Next OV: not scheduled   

## 2022-10-21 ENCOUNTER — Ambulatory Visit: Payer: BC Managed Care – PPO | Admitting: Podiatry

## 2022-10-21 DIAGNOSIS — B353 Tinea pedis: Secondary | ICD-10-CM | POA: Diagnosis not present

## 2022-10-21 DIAGNOSIS — B351 Tinea unguium: Secondary | ICD-10-CM

## 2022-10-21 DIAGNOSIS — Z79899 Other long term (current) drug therapy: Secondary | ICD-10-CM

## 2022-10-21 NOTE — Progress Notes (Signed)
Subjective:   Patient ID: Paul Rush, male   DOB: 32 y.o.   MRN: [id number]   HPI Chief Complaint  Patient presents with   Nail Problem    right toes have cloudy discolored nails, intermittent problem, cleared up and came back, terbinafine was the prior treatment     32 year old male presents the office with above concerns.  Was on Lamsil previously and it helped but a year ago the symptoms started to come back. The right big toenail did come off and it grew back in.  The time the nail will be uncomfortable.  No swelling redness or any drainage.   Review of Systems  All other systems reviewed and are negative.   Past Medical History:  Diagnosis Date   Asthma     Past Surgical History:  Procedure Laterality Date   CHOLECYSTECTOMY     WISDOM TOOTH EXTRACTION     WISDOM TOOTH EXTRACTION       Current Outpatient Medications:    albuterol (VENTOLIN HFA) 108 (90 Base) MCG/ACT inhaler, Inhale 2 puffs into the lungs every 6 (six) hours as needed for wheezing or shortness of breath., Disp: 8 g, Rfl: 0   cyanocobalamin (VITAMIN B12) 1000 MCG tablet, Take 1 tablet (1,000 mcg total) by mouth daily., Disp: 90 tablet, Rfl: 2   fluticasone (FLONASE) 50 MCG/ACT nasal spray, Place 2 sprays into both nostrils daily., Disp: 16 g, Rfl: 0   meclizine (ANTIVERT) 25 MG tablet, Take 1 tablet (25 mg total) by mouth 3 (three) times daily as needed for dizziness., Disp: 30 tablet, Rfl: 0   venlafaxine XR (EFFEXOR XR) 37.5 MG 24 hr capsule, Take 2 capsules (75 mg total) by mouth daily with breakfast for 14 days, THEN 1 capsule (37.5 mg total) daily with breakfast for 14 days, THEN 1 capsule (37.5 mg total) every other day for 14 days, THEN 1 capsule (37.5 mg total) every 3 (three) days for 14 days., Disp: 56 capsule, Rfl: 0   escitalopram (LEXAPRO) 5 MG tablet, Take 1 tablet (5 mg total) by mouth daily for 7 days, THEN 2 tablets (10 mg total) daily., Disp: 77 tablet, Rfl: 0  No Known  Allergies         Objective:  Physical Exam  General: AAO x3, NAD  Dermatological: Nails are mildly hypertrophic and are dystrophic with yellow, brown and white discoloration.  No edema, erythema or signs of infection.  Mild interdigital tinea pedis is present.  No open lesions.  No drainage.  Vascular: Dorsalis Pedis artery and Posterior Tibial artery pedal pulses are 2/4 bilateral with immedate capillary fill time. There is no pain with calf compression, swelling, warmth, erythema.   Neruologic: Grossly intact via light touch bilateral.   Musculoskeletal: No gross boney pedal deformities bilateral. No pain, crepitus, or limitation noted with foot and ankle range of motion bilateral. Muscular strength 5/5 in all groups tested bilateral.  Gait: Unassisted, Nonantalgic.       Assessment:   Onychomycosis, tinea pedis     Plan:  -Treatment options discussed including all alternatives, risks, and complications -Etiology of symptoms were discussed -Discussed treatment options for nail fungus.  Discussed oral, topical as well as alternative treatments.  Want to proceed with oral Lamisil.  Discussed side effects.  Will check a CBC and LFT prior to starting medication. -Discussed external measures such as antiperspirant spray, changing shoes and socks regularly to help prevent from this recurrence.    Trula Slade DPM

## 2022-10-21 NOTE — Patient Instructions (Signed)
Terbinafine Tablets What is this medication? TERBINAFINE (TER bin a feen) treats fungal infections of the nails. It belongs to a group of medications called antifungals. It will not treat infections caused by bacteria or viruses. This medicine may be used for other purposes; ask your health care provider or pharmacist if you have questions. COMMON BRAND NAME(S): Lamisil, Terbinex What should I tell my care team before I take this medication? They need to know if you have any of these conditions: Liver disease An unusual or allergic reaction to terbinafine, other medications, foods, dyes, or preservatives Pregnant or trying to get pregnant Breast-feeding How should I use this medication? Take this medication by mouth with water. Take it as directed on the prescription label at the same time every day. You can take it with or without food. If it upsets your stomach, take it with food. Keep taking it unless your care team tells you to stop. A special MedGuide will be given to you by the pharmacist with each prescription and refill. Be sure to read this information carefully each time. Talk to your care team regarding the use of this medication in children. Special care may be needed. Overdosage: If you think you have taken too much of this medicine contact a poison control center or emergency room at once. NOTE: This medicine is only for you. Do not share this medicine with others. What if I miss a dose? If you miss a dose, take it as soon as you can unless it is more than 4 hours late. If it is more than 4 hours late, skip the missed dose. Take the next dose at the normal time. What may interact with this medication? Do not take this medication with any of the following: Pimozide Thioridazine This medication may also interact with the following: Beta blockers Caffeine Certain medications for mental health conditions Cimetidine Cyclosporine Medications for fungal infections like fluconazole  and ketoconazole Medications for irregular heartbeat like amiodarone, flecainide and propafenone Rifampin Warfarin This list may not describe all possible interactions. Give your health care provider a list of all the medicines, herbs, non-prescription drugs, or dietary supplements you use. Also tell them if you smoke, drink alcohol, or use illegal drugs. Some items may interact with your medicine. What should I watch for while using this medication? Visit your care team for regular checks on your progress. You may need blood work while you are taking this medication. It may be some time before you see the benefit from this medication. This medication may cause serious skin reactions. They can happen weeks to months after starting the medication. Contact your care team right away if you notice fevers or flu-like symptoms with a rash. The rash may be red or purple and then turn into blisters or peeling of the skin. Or, you might notice a red rash with swelling of the face, lips or lymph nodes in your neck or under your arms. This medication can make you more sensitive to the sun. Keep out of the sun, If you cannot avoid being in the sun, wear protective clothing and sunscreen. Do not use sun lamps or tanning beds/booths. What side effects may I notice from receiving this medication? Side effects that you should report to your care team as soon as possible: Allergic reactions--skin rash, itching, hives, swelling of the face, lips, tongue, or throat Change in sense of smell Change in taste Infection--fever, chills, cough, or sore throat Liver injury--right upper belly pain, loss of appetite, nausea,  light-colored stool, dark yellow or brown urine, yellowing skin or eyes, unusual weakness or fatigue Low red blood cell level--unusual weakness or fatigue, dizziness, headache, trouble breathing Lupus-like syndrome--joint pain, swelling, or stiffness, butterfly-shaped rash on the face, rashes that get worse  in the sun, fever, unusual weakness or fatigue Rash, fever, and swollen lymph nodes Redness, blistering, peeling, or loosening of the skin, including inside the mouth Unusual bruising or bleeding Worsening mood, feelings of depression Side effects that usually do not require medical attention (report to your care team if they continue or are bothersome): Diarrhea Gas Headache Nausea Stomach pain Upset stomach This list may not describe all possible side effects. Call your doctor for medical advice about side effects. You may report side effects to FDA at 1-800-FDA-1088. Where should I keep my medication? Keep out of the reach of children and pets. Store between 20 and 25 degrees C (68 and 77 degrees F). Protect from light. Get rid of any unused medication after the expiration date. To get rid of medications that are no longer needed or have expired: Take the medication to a medication take-back program. Check with your pharmacy or law enforcement to find a location. If you cannot return the medication, check the label or package insert to see if the medication should be thrown out in the garbage or flushed down the toilet. If you are not sure, ask your care team. If it is safe to put it in the trash, take the medication out of the container. Mix the medication with cat litter, dirt, coffee grounds, or other unwanted substance. Seal the mixture in a bag or container. Put it in the trash. NOTE: This sheet is a summary. It may not cover all possible information. If you have questions about this medicine, talk to your doctor, pharmacist, or health care provider.  2023 Elsevier/Gold Standard (2021-04-09 00:00:00)

## 2022-10-22 LAB — CBC WITH DIFFERENTIAL/PLATELET
Basophils Absolute: 0 10*3/uL (ref 0.0–0.2)
Basos: 0 %
EOS (ABSOLUTE): 0.1 10*3/uL (ref 0.0–0.4)
Eos: 1 %
Hematocrit: 49.7 % (ref 37.5–51.0)
Hemoglobin: 16.7 g/dL (ref 13.0–17.7)
Immature Grans (Abs): 0 10*3/uL (ref 0.0–0.1)
Immature Granulocytes: 0 %
Lymphocytes Absolute: 3.2 10*3/uL — ABNORMAL HIGH (ref 0.7–3.1)
Lymphs: 35 %
MCH: 30.5 pg (ref 26.6–33.0)
MCHC: 33.6 g/dL (ref 31.5–35.7)
MCV: 91 fL (ref 79–97)
Monocytes Absolute: 0.5 10*3/uL (ref 0.1–0.9)
Monocytes: 5 %
Neutrophils Absolute: 5.4 10*3/uL (ref 1.4–7.0)
Neutrophils: 59 %
Platelets: 287 10*3/uL (ref 150–450)
RBC: 5.47 x10E6/uL (ref 4.14–5.80)
RDW: 12.7 % (ref 11.6–15.4)
WBC: 9.1 10*3/uL (ref 3.4–10.8)

## 2022-10-22 LAB — HEPATIC FUNCTION PANEL
ALT: 19 IU/L (ref 0–44)
AST: 23 IU/L (ref 0–40)
Albumin: 5.3 g/dL — ABNORMAL HIGH (ref 4.1–5.1)
Alkaline Phosphatase: 110 IU/L (ref 44–121)
Bilirubin Total: 0.6 mg/dL (ref 0.0–1.2)
Bilirubin, Direct: 0.12 mg/dL (ref 0.00–0.40)
Total Protein: 8 g/dL (ref 6.0–8.5)

## 2022-10-24 ENCOUNTER — Other Ambulatory Visit: Payer: Self-pay | Admitting: Podiatry

## 2022-10-24 DIAGNOSIS — Z79899 Other long term (current) drug therapy: Secondary | ICD-10-CM

## 2022-10-24 MED ORDER — TERBINAFINE HCL 250 MG PO TABS
250.0000 mg | ORAL_TABLET | Freq: Every day | ORAL | 0 refills | Status: DC
Start: 2022-10-24 — End: 2024-01-19

## 2022-10-29 ENCOUNTER — Ambulatory Visit (INDEPENDENT_AMBULATORY_CARE_PROVIDER_SITE_OTHER): Payer: BC Managed Care – PPO | Admitting: Family Medicine

## 2022-10-29 ENCOUNTER — Encounter: Payer: Self-pay | Admitting: Family Medicine

## 2022-10-29 VITALS — BP 100/82 | HR 94 | Temp 97.7°F | Resp 16 | Wt 154.2 lb

## 2022-10-29 DIAGNOSIS — Z2981 Encounter for HIV pre-exposure prophylaxis: Secondary | ICD-10-CM

## 2022-10-29 DIAGNOSIS — J4521 Mild intermittent asthma with (acute) exacerbation: Secondary | ICD-10-CM

## 2022-10-29 DIAGNOSIS — H6991 Unspecified Eustachian tube disorder, right ear: Secondary | ICD-10-CM

## 2022-10-29 DIAGNOSIS — J4541 Moderate persistent asthma with (acute) exacerbation: Secondary | ICD-10-CM

## 2022-10-29 DIAGNOSIS — Z23 Encounter for immunization: Secondary | ICD-10-CM | POA: Diagnosis not present

## 2022-10-29 DIAGNOSIS — U071 COVID-19: Secondary | ICD-10-CM

## 2022-10-29 LAB — CBC WITH DIFFERENTIAL/PLATELET
Basophils Absolute: 0.1 10*3/uL (ref 0.0–0.1)
Basophils Relative: 0.5 % (ref 0.0–3.0)
Eosinophils Absolute: 0.2 10*3/uL (ref 0.0–0.7)
Eosinophils Relative: 1.5 % (ref 0.0–5.0)
HCT: 46 % (ref 39.0–52.0)
Hemoglobin: 15.5 g/dL (ref 13.0–17.0)
Lymphocytes Relative: 19.1 % (ref 12.0–46.0)
Lymphs Abs: 2.1 10*3/uL (ref 0.7–4.0)
MCHC: 33.8 g/dL (ref 30.0–36.0)
MCV: 90.2 fl (ref 78.0–100.0)
Monocytes Absolute: 0.9 10*3/uL (ref 0.1–1.0)
Monocytes Relative: 7.8 % (ref 3.0–12.0)
Neutro Abs: 8 10*3/uL — ABNORMAL HIGH (ref 1.4–7.7)
Neutrophils Relative %: 71.1 % (ref 43.0–77.0)
Platelets: 300 10*3/uL (ref 150.0–400.0)
RBC: 5.09 Mil/uL (ref 4.22–5.81)
RDW: 13.2 % (ref 11.5–15.5)
WBC: 11.2 10*3/uL — ABNORMAL HIGH (ref 4.0–10.5)

## 2022-10-29 LAB — COMPREHENSIVE METABOLIC PANEL
ALT: 13 U/L (ref 0–53)
AST: 16 U/L (ref 0–37)
Albumin: 4.7 g/dL (ref 3.5–5.2)
Alkaline Phosphatase: 93 U/L (ref 39–117)
BUN: 11 mg/dL (ref 6–23)
CO2: 30 mEq/L (ref 19–32)
Calcium: 9.7 mg/dL (ref 8.4–10.5)
Chloride: 99 mEq/L (ref 96–112)
Creatinine, Ser: 0.95 mg/dL (ref 0.40–1.50)
GFR: 104.85 mL/min (ref >60.00)
Glucose, Bld: 93 mg/dL (ref 70–99)
Potassium: 3.9 mEq/L (ref 3.5–5.1)
Sodium: 138 mEq/L (ref 135–145)
Total Bilirubin: 0.8 mg/dL (ref 0.2–1.2)
Total Protein: 7.7 g/dL (ref 6.0–8.3)

## 2022-10-29 MED ORDER — BENZONATATE 200 MG PO CAPS: 200.0000 mg | ORAL_CAPSULE | Freq: Two times a day (BID) | ORAL | 0 refills | Status: DC | PRN

## 2022-10-29 MED ORDER — EMTRICITABINE-TENOFOVIR AF 200-25 MG PO TABS: 1.0000 | ORAL_TABLET | Freq: Every day | ORAL | 2 refills | Status: DC

## 2022-10-29 MED ORDER — PREDNISONE 20 MG PO TABS: 20.0000 mg | ORAL_TABLET | Freq: Two times a day (BID) | ORAL | 0 refills | Status: AC

## 2022-10-29 MED ORDER — ALBUTEROL SULFATE HFA 108 (90 BASE) MCG/ACT IN AERS: 2.0000 | INHALATION_SPRAY | Freq: Four times a day (QID) | RESPIRATORY_TRACT | 0 refills | Status: DC | PRN

## 2022-10-29 NOTE — Progress Notes (Signed)
Established Patient Office Visit   Subjective:  Patient ID: Paul Rush, male    DOB: 15-Nov-1990  Age: 32 y.o. MRN: [id number]  Chief Complaint  Patient presents with   URI   Start new medication     Pt would like to start a new medication called Prep.  Also, pt c/o Fever,chills, headache and congestion. This started Saturday     URI  Associated symptoms include congestion, coughing, ear pain, joint pain, a sore throat and wheezing. Pertinent negatives include no abdominal pain or rash.   Encounter Diagnoses  Name Primary?   Need for influenza vaccination Yes   COVID-19 virus detected    Encounter for HIV pre-exposure prophylaxis    Need for Tdap vaccination    Dysfunction of right eustachian tube    Mild intermittent reactive airway disease with acute exacerbation    Moderate persistent asthma with acute exacerbation    4-day history of hot and cold chills, nasal congestion postnasal drip, cough with wheezing and tightness in the chest.  Reports myalgias and arthralgias.  Mild sore throat.  Denies headache or nausea.  Right greater than left ear congestion.  Would like to start on prep.  MSM.   Review of Systems  Constitutional: Negative.   HENT:  Positive for congestion, ear pain and sore throat.   Eyes:  Negative for blurred vision, discharge and redness.  Respiratory:  Positive for cough and wheezing. Negative for sputum production.   Cardiovascular: Negative.   Gastrointestinal:  Negative for abdominal pain.  Genitourinary: Negative.   Musculoskeletal:  Positive for joint pain and myalgias.  Skin:  Negative for rash.  Neurological:  Negative for tingling, loss of consciousness and weakness.  Endo/Heme/Allergies:  Negative for polydipsia.     Current Outpatient Medications:    benzonatate (TESSALON) 200 MG capsule, Take 1 capsule (200 mg total) by mouth 2 (two) times daily as needed for cough., Disp: 20 capsule, Rfl: 0   emtricitabine-tenofovir AF (DESCOVY)  200-25 MG tablet, Take 1 tablet by mouth daily., Disp: 30 tablet, Rfl: 2   predniSONE (DELTASONE) 20 MG tablet, Take 1 tablet (20 mg total) by mouth 2 (two) times daily with a meal for 7 days., Disp: 14 tablet, Rfl: 0   venlafaxine XR (EFFEXOR XR) 37.5 MG 24 hr capsule, Take 2 capsules (75 mg total) by mouth daily with breakfast for 14 days, THEN 1 capsule (37.5 mg total) daily with breakfast for 14 days, THEN 1 capsule (37.5 mg total) every other day for 14 days, THEN 1 capsule (37.5 mg total) every 3 (three) days for 14 days., Disp: 56 capsule, Rfl: 0   albuterol (VENTOLIN HFA) 108 (90 Base) MCG/ACT inhaler, Inhale 2 puffs into the lungs every 6 (six) hours as needed for wheezing or shortness of breath., Disp: 8 g, Rfl: 0   cyanocobalamin (VITAMIN B12) 1000 MCG tablet, Take 1 tablet (1,000 mcg total) by mouth daily., Disp: 90 tablet, Rfl: 2   escitalopram (LEXAPRO) 5 MG tablet, Take 1 tablet (5 mg total) by mouth daily for 7 days, THEN 2 tablets (10 mg total) daily. (Patient not taking: Reported on 10/29/2022), Disp: 77 tablet, Rfl: 0   fluticasone (FLONASE) 50 MCG/ACT nasal spray, Place 2 sprays into both nostrils daily., Disp: 16 g, Rfl: 0   meclizine (ANTIVERT) 25 MG tablet, Take 1 tablet (25 mg total) by mouth 3 (three) times daily as needed for dizziness. (Patient not taking: Reported on 10/29/2022), Disp: 30 tablet, Rfl: 0  terbinafine (LAMISIL) 250 MG tablet, Take 1 tablet (250 mg total) by mouth daily., Disp: 90 tablet, Rfl: 0   Objective:     BP 100/82 (BP Location: Left Arm, Patient Position: Sitting, Cuff Size: Normal)   Pulse 94   Temp 97.7 F (36.5 C)   Resp 16   Wt 154 lb 3.2 oz (69.9 kg)   SpO2 92%   BMI 24.89 kg/m    Physical Exam Constitutional:      General: He is not in acute distress.    Appearance: Normal appearance. He is not ill-appearing, toxic-appearing or diaphoretic.  HENT:     Head: Normocephalic and atraumatic.     Right Ear: External ear normal. No middle  ear effusion. Tympanic membrane is retracted. Tympanic membrane is not erythematous.     Left Ear: Tympanic membrane, ear canal and external ear normal.  No middle ear effusion. Tympanic membrane is not erythematous or retracted.     Mouth/Throat:     Mouth: Mucous membranes are moist.     Pharynx: Oropharynx is clear. No oropharyngeal exudate or posterior oropharyngeal erythema.  Eyes:     General: No scleral icterus.       Right eye: No discharge.        Left eye: No discharge.     Extraocular Movements: Extraocular movements intact.     Conjunctiva/sclera: Conjunctivae normal.     Pupils: Pupils are equal, round, and reactive to light.  Cardiovascular:     Rate and Rhythm: Normal rate and regular rhythm.  Pulmonary:     Effort: Pulmonary effort is normal. No respiratory distress.     Breath sounds: Normal breath sounds. No wheezing or rales.  Abdominal:     General: Bowel sounds are normal.     Tenderness: There is no abdominal tenderness. There is no guarding.  Musculoskeletal:     Cervical back: No rigidity or tenderness.  Skin:    General: Skin is warm and dry.  Neurological:     Mental Status: He is alert and oriented to person, place, and time.  Psychiatric:        Mood and Affect: Mood normal.        Behavior: Behavior normal.      No results found for any visits on 10/29/22.    The ASCVD Risk score (Arnett DK, et al., 2019) failed to calculate for the following reasons:   The 2019 ASCVD risk score is only valid for ages 66 to 34    Assessment & Plan:   Need for influenza vaccination -     Flu Vaccine QUAD 95mo+IM (Fluarix, Fluzone & Alfiuria Quad PF)  COVID-19 virus detected -     CBC with Differential/Platelet -     Benzonatate; Take 1 capsule (200 mg total) by mouth 2 (two) times daily as needed for cough.  Dispense: 20 capsule; Refill: 0  Encounter for HIV pre-exposure prophylaxis -     Comprehensive metabolic panel -     HIV Antibody (routine testing w  rflx) -     Emtricitabine-Tenofovir AF; Take 1 tablet by mouth daily.  Dispense: 30 tablet; Refill: 2 -     HIV Antibody (routine testing w rflx); Future  Need for Tdap vaccination -     Tdap vaccine greater than or equal to 7yo IM  Dysfunction of right eustachian tube -     predniSONE; Take 1 tablet (20 mg total) by mouth 2 (two) times daily with a meal for 7  days.  Dispense: 14 tablet; Refill: 0  Mild intermittent reactive airway disease with acute exacerbation -     predniSONE; Take 1 tablet (20 mg total) by mouth 2 (two) times daily with a meal for 7 days.  Dispense: 14 tablet; Refill: 0  Moderate persistent asthma with acute exacerbation -     Albuterol Sulfate HFA; Inhale 2 puffs into the lungs every 6 (six) hours as needed for wheezing or shortness of breath.  Dispense: 8 g; Refill: 0    Return in about 3 months (around 01/27/2023), or Return in 1 week if not improving.Marland Kitchen  HIV test every 3 months.  Will need to monitor renal function.  This patient could be at risk for osteo pia.  Information was given on disco V.  Libby Maw, MD

## 2022-10-30 LAB — HIV ANTIBODY (ROUTINE TESTING W REFLEX): HIV 1&2 Ab, 4th Generation: NONREACTIVE

## 2022-11-03 ENCOUNTER — Telehealth: Payer: Self-pay | Admitting: Family Medicine

## 2022-11-03 DIAGNOSIS — U071 COVID-19: Secondary | ICD-10-CM

## 2022-11-03 MED ORDER — PROMETHAZINE-DM 6.25-15 MG/5ML PO SYRP: 5.0000 mL | ORAL_SOLUTION | Freq: Four times a day (QID) | ORAL | 0 refills | Status: DC | PRN

## 2022-11-03 NOTE — Telephone Encounter (Signed)
Please advise message below last OV 10/29/22

## 2022-11-03 NOTE — Telephone Encounter (Signed)
Pt stated that the benzonatate for his cough is not working and would like to have something else. [phone]

## 2022-11-03 NOTE — Telephone Encounter (Signed)
Sent message via Mychart to inform patient of Rx that was sent in.

## 2022-12-02 ENCOUNTER — Other Ambulatory Visit: Payer: Self-pay | Admitting: Family Medicine

## 2022-12-02 DIAGNOSIS — Z2981 Encounter for HIV pre-exposure prophylaxis: Secondary | ICD-10-CM

## 2022-12-08 ENCOUNTER — Other Ambulatory Visit: Payer: Self-pay | Admitting: Family Medicine

## 2022-12-08 ENCOUNTER — Telehealth: Payer: Self-pay | Admitting: Family Medicine

## 2022-12-08 DIAGNOSIS — F418 Other specified anxiety disorders: Secondary | ICD-10-CM

## 2022-12-08 NOTE — Telephone Encounter (Signed)
Spoke to pharmacy staff, stated that they were able to get the order

## 2022-12-08 NOTE — Telephone Encounter (Signed)
Call from [facility] in [place] did not come through and request we resend script for Descovy. Pt is out of medication and needing to pick up today.

## 2022-12-08 NOTE — Telephone Encounter (Signed)
error 

## 2022-12-15 ENCOUNTER — Telehealth: Payer: Self-pay | Admitting: Family Medicine

## 2022-12-15 DIAGNOSIS — F418 Other specified anxiety disorders: Secondary | ICD-10-CM

## 2022-12-15 NOTE — Telephone Encounter (Signed)
Refill request for pending Rx last OV 10/29/22 last refill 10/09/22 per patient Rx was denied for refill patient not sure as to why. Please advise.

## 2022-12-15 NOTE — Telephone Encounter (Signed)
Pt has requested a refill on his venlafaxine XR (EFFEXOR XR) 37.5 MG 24 hr capsule [id number]  ENDED  and it was denied. He is wondering why this has been denied. Please advise pt at [phone]   [facility], [address], [place] [address] Phone: [phone]  Fax: [phone] DEA #: [id number]

## 2022-12-16 NOTE — Telephone Encounter (Signed)
Spoke to patient, he states that he has been taking the Venlafaxine 37.5 mg daily. He never tapered off of the medication and he reports that his anxiety has been doing good.  He is out of medication and would like to get a refill on this.  Please review and advise.  Thanks. Dm/cma

## 2022-12-16 NOTE — Telephone Encounter (Signed)
Called patient to inform that he was tapered off of the venlafaxine and started on Lexapro. No answer unable to LM will call back.

## 2022-12-17 NOTE — Telephone Encounter (Signed)
Patient scheduled 12/18/22 @ 8:00 am. Dm/cma

## 2022-12-18 ENCOUNTER — Encounter: Payer: Self-pay | Admitting: Family Medicine

## 2022-12-18 ENCOUNTER — Ambulatory Visit: Payer: BC Managed Care – PPO | Admitting: Family Medicine

## 2022-12-18 VITALS — BP 118/74 | HR 98 | Temp 97.9°F | Ht 66.0 in | Wt 157.8 lb

## 2022-12-18 DIAGNOSIS — Z79899 Other long term (current) drug therapy: Secondary | ICD-10-CM | POA: Diagnosis not present

## 2022-12-18 DIAGNOSIS — F418 Other specified anxiety disorders: Secondary | ICD-10-CM | POA: Diagnosis not present

## 2022-12-18 MED ORDER — VENLAFAXINE HCL ER 150 MG PO CP24: 150.0000 mg | ORAL_CAPSULE | Freq: Every day | ORAL | 3 refills | Status: DC

## 2022-12-18 NOTE — Progress Notes (Signed)
Established Patient Office Visit   Subjective:  Patient ID: Paul Rush, male    DOB: 09-07-1991  Age: 32 y.o. MRN: [id number]  Chief Complaint  Patient presents with   Medical Management of Chronic Issues    Refill/follow up on medications. No concerns.     HPI Encounter Diagnoses  Name Primary?   Depression with anxiety Yes   On pre-exposure prophylaxis for HIV    Has been doing well on the venlafaxine XR 150 mg for depression anxiety and focus.  He never started the taper of venlafaxine.  Never started Celexa.  Would like to continue the venlafaxine.  He has been off of it now for 4 to 5 days and misses it.  Doing well with prep.  Due for follow-up HIV in the next month.  It is on his calendar.   Review of Systems  Constitutional: Negative.   HENT: Negative.    Eyes:  Negative for blurred vision, discharge and redness.  Respiratory: Negative.    Cardiovascular: Negative.   Gastrointestinal:  Negative for abdominal pain.  Genitourinary: Negative.   Musculoskeletal: Negative.  Negative for myalgias.  Skin:  Negative for rash.  Neurological:  Negative for tingling, loss of consciousness and weakness.  Endo/Heme/Allergies:  Negative for polydipsia.      12/18/2022    8:20 AM 12/18/2022    8:04 AM 10/29/2022    8:55 AM  Depression screen PHQ 2/9  Decreased Interest 2 0 0  Down, Depressed, Hopeless 1 1 0  PHQ - 2 Score 3 1 0  Altered sleeping 3    Tired, decreased energy 3    Change in appetite 2    Feeling bad or failure about yourself  2    Trouble concentrating 2    Moving slowly or fidgety/restless 2    Suicidal thoughts 0    PHQ-9 Score 17    Difficult doing work/chores Somewhat difficult        Current Outpatient Medications:    albuterol (VENTOLIN HFA) 108 (90 Base) MCG/ACT inhaler, Inhale 2 puffs into the lungs every 6 (six) hours as needed for wheezing or shortness of breath., Disp: 8 g, Rfl: 0   cyanocobalamin (VITAMIN B12) 1000 MCG tablet, Take 1  tablet (1,000 mcg total) by mouth daily., Disp: 90 tablet, Rfl: 2   DESCOVY 200-25 MG tablet, TAKE 1 TABLET BY MOUTH EVERY DAY, Disp: 30 tablet, Rfl: 2   fluticasone (FLONASE) 50 MCG/ACT nasal spray, Place 2 sprays into both nostrils daily., Disp: 16 g, Rfl: 0   meclizine (ANTIVERT) 25 MG tablet, Take 1 tablet (25 mg total) by mouth 3 (three) times daily as needed for dizziness., Disp: 30 tablet, Rfl: 0   terbinafine (LAMISIL) 250 MG tablet, Take 1 tablet (250 mg total) by mouth daily., Disp: 90 tablet, Rfl: 0   venlafaxine XR (EFFEXOR-XR) 150 MG 24 hr capsule, Take 1 capsule (150 mg total) by mouth daily with breakfast., Disp: 90 capsule, Rfl: 3   Objective:     BP 118/74 (BP Location: Right Arm, Patient Position: Sitting, Cuff Size: Normal)   Pulse 98   Temp 97.9 F (36.6 C) (Temporal)   Ht 5\' 6"  (1.676 m)   Wt 157 lb 12.8 oz (71.6 kg)   SpO2 93%   BMI 25.47 kg/m    Physical Exam Constitutional:      General: He is not in acute distress.    Appearance: Normal appearance. He is not ill-appearing, toxic-appearing or diaphoretic.  HENT:  Head: Normocephalic and atraumatic.     Right Ear: External ear normal.     Left Ear: External ear normal.  Eyes:     General: No scleral icterus.       Right eye: No discharge.        Left eye: No discharge.     Extraocular Movements: Extraocular movements intact.     Conjunctiva/sclera: Conjunctivae normal.  Pulmonary:     Effort: Pulmonary effort is normal. No respiratory distress.  Skin:    General: Skin is warm and dry.  Neurological:     Mental Status: He is alert and oriented to person, place, and time.  Psychiatric:        Mood and Affect: Mood normal.        Behavior: Behavior normal.      No results found for any visits on 12/18/22.    The ASCVD Risk score (Arnett DK, et al., 2019) failed to calculate for the following reasons:   The 2019 ASCVD risk score is only valid for ages 49 to 34    Assessment & Plan:    Depression with anxiety -     Venlafaxine HCl ER; Take 1 capsule (150 mg total) by mouth daily with breakfast.  Dispense: 90 capsule; Refill: 3  On pre-exposure prophylaxis for HIV    Return in about 6 months (around 06/20/2023), or if symptoms worsen or fail to improve.  Being off of venlafaxine for for 5 days affected his response on the PHQ-9.  Assures me that he is normally doing much better than that.  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  Libby Maw, MD

## 2022-12-21 ENCOUNTER — Other Ambulatory Visit: Payer: Self-pay | Admitting: Family Medicine

## 2022-12-21 DIAGNOSIS — F418 Other specified anxiety disorders: Secondary | ICD-10-CM

## 2023-01-12 ENCOUNTER — Encounter: Payer: Self-pay | Admitting: *Deleted

## 2023-02-09 ENCOUNTER — Other Ambulatory Visit: Payer: Self-pay | Admitting: Family Medicine

## 2023-02-09 DIAGNOSIS — Z2981 Encounter for HIV pre-exposure prophylaxis: Secondary | ICD-10-CM

## 2023-03-30 ENCOUNTER — Ambulatory Visit
Admission: EM | Admit: 2023-03-30 | Discharge: 2023-03-30 | Disposition: A | Discharge status: Home / Self Care | Payer: BC Managed Care – PPO | Attending: Urgent Care | Admitting: Urgent Care

## 2023-03-30 DIAGNOSIS — H65191 Other acute nonsuppurative otitis media, right ear: Secondary | ICD-10-CM

## 2023-03-30 MED ORDER — CETIRIZINE HCL 10 MG PO TABS: 10.0000 mg | ORAL_TABLET | Freq: Every day | ORAL | 0 refills | Status: DC

## 2023-03-30 MED ORDER — AMOXICILLIN 875 MG PO TABS: 875.0000 mg | ORAL_TABLET | Freq: Two times a day (BID) | ORAL | 0 refills | Status: DC

## 2023-03-30 MED ORDER — PSEUDOEPHEDRINE HCL 60 MG PO TABS: 60.0000 mg | ORAL_TABLET | Freq: Three times a day (TID) | ORAL | 0 refills | Status: DC | PRN

## 2023-03-30 NOTE — ED Triage Notes (Signed)
Pt reports right ear clogged and pain x 4 days. States he is feeling nauseas ad dizzy du to ear clogged.

## 2023-03-30 NOTE — ED Provider Notes (Signed)
[facility]  Note:  This document was prepared using Conservation officer, historic buildings and may include unintentional dictation errors.  MRN: [id number] DOB: Jan 24, 1991  Subjective:   Paul Rush is a 32 y.o. male presenting for 4 day history of acute onset right ear pain, right ear fullness, dizziness, nausea. Last week flew to [facility], did a lot of swimming there. Flew back over the weekend.   No current facility-administered medications for this encounter.  Current Outpatient Medications:    albuterol (VENTOLIN HFA) 108 (90 Base) MCG/ACT inhaler, Inhale 2 puffs into the lungs every 6 (six) hours as needed for wheezing or shortness of breath., Disp: 8 g, Rfl: 0   cyanocobalamin (VITAMIN B12) 1000 MCG tablet, Take 1 tablet (1,000 mcg total) by mouth daily., Disp: 90 tablet, Rfl: 2   DESCOVY 200-25 MG tablet, TAKE 1 TABLET BY MOUTH EVERY DAY, Disp: 30 tablet, Rfl: 2   fluticasone (FLONASE) 50 MCG/ACT nasal spray, Place 2 sprays into both nostrils daily., Disp: 16 g, Rfl: 0   meclizine (ANTIVERT) 25 MG tablet, Take 1 tablet (25 mg total) by mouth 3 (three) times daily as needed for dizziness., Disp: 30 tablet, Rfl: 0   terbinafine (LAMISIL) 250 MG tablet, Take 1 tablet (250 mg total) by mouth daily., Disp: 90 tablet, Rfl: 0   venlafaxine XR (EFFEXOR-XR) 150 MG 24 hr capsule, Take 1 capsule (150 mg total) by mouth daily with breakfast., Disp: 90 capsule, Rfl: 3   No Known Allergies  Past Medical History:  Diagnosis Date   Asthma      Past Surgical History:  Procedure Laterality Date   CHOLECYSTECTOMY     WISDOM TOOTH EXTRACTION     WISDOM TOOTH EXTRACTION      Family History  Problem Relation Age of Onset   Healthy Mother     Social History   Tobacco Use   Smoking status: Never   Smokeless tobacco: Never  Vaping Use   Vaping Use: Never used  Substance Use Topics   Alcohol use: Yes    Alcohol/week: 2.0 standard drinks of alcohol     Types: 2 Standard drinks or equivalent per week    Comment: Occa   Drug use: No    ROS   Objective:   Vitals: BP 126/76 (BP Location: Right Arm)   Pulse 75   Temp 98.5 F (36.9 C) (Oral)   Resp 16   SpO2 98%   Physical Exam Constitutional:      General: He is not in acute distress.    Appearance: Normal appearance. He is well-developed and normal weight. He is not ill-appearing, toxic-appearing or diaphoretic.  HENT:     Head: Normocephalic and atraumatic.     Right Ear: Ear canal and external ear normal. A middle ear effusion is present. There is no impacted cerumen. Tympanic membrane is erythematous and bulging.     Left Ear: Tympanic membrane, ear canal and external ear normal.  No middle ear effusion. There is no impacted cerumen. Tympanic membrane is not erythematous or bulging.     Nose: Nose normal.     Mouth/Throat:     Pharynx: Oropharynx is clear.  Eyes:     General: No scleral icterus.       Right eye: No discharge.        Left eye: No discharge.     Extraocular Movements: Extraocular movements intact.  Cardiovascular:     Rate and Rhythm: Normal rate.  Pulmonary:     Effort: Pulmonary effort is normal.  Musculoskeletal:     Cervical back: Normal range of motion.  Neurological:     Mental Status: He is alert and oriented to person, place, and time.  Psychiatric:        Mood and Affect: Mood normal.        Behavior: Behavior normal.        Thought Content: Thought content normal.        Judgment: Judgment normal.     Assessment and Plan :   PDMP not reviewed this encounter.  1. Other non-recurrent acute nonsuppurative otitis media of right ear    Start amoxicillin to cover for otitis media. Use supportive care otherwise. Counseled patient on potential for adverse effects with medications prescribed/recommended today, ER and return-to-clinic precautions discussed, patient verbalized understanding.     Wallis Bamberg, [place] 03/30/23 1720

## 2023-04-01 ENCOUNTER — Other Ambulatory Visit: Payer: BC Managed Care – PPO

## 2023-04-01 DIAGNOSIS — Z2981 Encounter for HIV pre-exposure prophylaxis: Secondary | ICD-10-CM

## 2023-04-01 NOTE — Addendum Note (Signed)
Addended by: Lindley Magnus L on: 04/01/2023 08:12 AM   Modules accepted: Orders

## 2023-04-01 NOTE — Addendum Note (Signed)
Addended by: Andrez Grime on: 04/01/2023 09:36 AM   Modules accepted: Orders

## 2023-04-02 LAB — HIV ANTIBODY (ROUTINE TESTING W REFLEX): HIV 1&2 Ab, 4th Generation: NONREACTIVE

## 2023-04-30 ENCOUNTER — Other Ambulatory Visit: Payer: Self-pay | Admitting: Family Medicine

## 2023-04-30 DIAGNOSIS — Z2981 Encounter for HIV pre-exposure prophylaxis: Secondary | ICD-10-CM

## 2024-01-14 ENCOUNTER — Telehealth: Payer: Self-pay | Admitting: Physician Assistant

## 2024-01-14 NOTE — Telephone Encounter (Signed)
 Open in error

## 2024-01-19 ENCOUNTER — Encounter: Payer: Self-pay | Admitting: Physician Assistant

## 2024-01-19 ENCOUNTER — Ambulatory Visit: Payer: Self-pay | Admitting: Physician Assistant

## 2024-01-19 VITALS — BP 110/62 | HR 86 | Temp 97.8°F | Ht 65.5 in | Wt 168.8 lb

## 2024-01-19 DIAGNOSIS — R350 Frequency of micturition: Secondary | ICD-10-CM

## 2024-01-19 DIAGNOSIS — J4520 Mild intermittent asthma, uncomplicated: Secondary | ICD-10-CM

## 2024-01-19 DIAGNOSIS — J45909 Unspecified asthma, uncomplicated: Secondary | ICD-10-CM | POA: Insufficient documentation

## 2024-01-19 DIAGNOSIS — G43109 Migraine with aura, not intractable, without status migrainosus: Secondary | ICD-10-CM

## 2024-01-19 DIAGNOSIS — E538 Deficiency of other specified B group vitamins: Secondary | ICD-10-CM | POA: Diagnosis not present

## 2024-01-19 DIAGNOSIS — N3281 Overactive bladder: Secondary | ICD-10-CM

## 2024-01-19 DIAGNOSIS — F418 Other specified anxiety disorders: Secondary | ICD-10-CM

## 2024-01-19 DIAGNOSIS — K219 Gastro-esophageal reflux disease without esophagitis: Secondary | ICD-10-CM | POA: Insufficient documentation

## 2024-01-19 LAB — POCT URINALYSIS DIP (CLINITEK)
Bilirubin, UA: NEGATIVE
Blood, UA: NEGATIVE
Glucose, UA: NEGATIVE mg/dL
Ketones, POC UA: NEGATIVE mg/dL
Leukocytes, UA: NEGATIVE
Nitrite, UA: NEGATIVE
POC PROTEIN,UA: NEGATIVE
Spec Grav, UA: 1.025 (ref 1.010–1.025)
Urobilinogen, UA: NEGATIVE U/dL — AB
pH, UA: 6 (ref 5.0–8.0)

## 2024-01-19 MED ORDER — OMEPRAZOLE 40 MG PO CPDR: 40.0000 mg | DELAYED_RELEASE_CAPSULE | Freq: Every day | ORAL | 3 refills | Status: DC

## 2024-01-19 NOTE — Assessment & Plan Note (Signed)
 Urinary frequency for three months with nocturia. Differential includes diabetes, thyroid issues, and prostate problems. Urinalysis negative. Family history of diabetes and thyroid issues. Discussed PSA test implications. - Order lab work for diabetes and thyroid screening. - Order PSA test for prostate evaluation. - Consider medication for urinary frequency if labs are normal.

## 2024-01-19 NOTE — Assessment & Plan Note (Signed)
 Symptoms consistent with acid reflux. Discussed omeprazole  as treatment, noting long-term use may affect vitamin absorption. - Prescribe omeprazole  once daily, 30 minutes before eating or at night. - Educate on potential side effects of omeprazole , including vitamin absorption issues.

## 2024-01-19 NOTE — Assessment & Plan Note (Signed)
 Controlled Takes OTC allergy medicine that helps control it Denies any changes or worsening symptoms Will continue to monitor

## 2024-01-19 NOTE — Assessment & Plan Note (Signed)
 History of somnolence Has been taking B12  Denies any abnormal symptoms Labs drawn today Will adjust treatment depending on symptoms

## 2024-01-19 NOTE — Patient Instructions (Signed)
 VISIT SUMMARY:  You came in today because you have been experiencing increased urinary frequency over the past three months, along with a persistent need to clear your throat and occasional difficulty swallowing. You also have a history of asthma and acid reflux, which we discussed during your visit.  YOUR PLAN:  -URINARY FREQUENCY: Urinary frequency means needing to urinate more often than usual. We will check for diabetes, thyroid issues, and prostate problems as potential causes. You will have lab work done to screen for diabetes and thyroid issues, and a PSA test to evaluate your prostate. If these tests are normal, we may consider medication to help with the urinary frequency.  -ACID REFLUX: Acid reflux occurs when stomach acid flows back into the tube connecting your mouth and stomach, causing discomfort. We will start you on omeprazole , which you should take once daily, 30 minutes before eating or at night. Be aware that long-term use of omeprazole  can affect vitamin absorption.  -SEASONAL ALLERGIES: Seasonal allergies are allergic reactions that happen during certain times of the year. Continue taking your over-the-counter allergy medications and Benadryl as needed to manage your symptoms.  -ASTHMA: Asthma is a condition in which your airways narrow and swell, producing extra mucus and making it difficult to breathe. Your symptoms are well-controlled with your current over-the-counter allergy medications, so continue with your current management plan.  INSTRUCTIONS:  Please complete the lab work for diabetes and thyroid screening, and the PSA test for prostate evaluation as soon as possible. Follow up with us  once the results are available to discuss the next steps.

## 2024-01-19 NOTE — Progress Notes (Signed)
 Subjective:  Patient ID: Paul Rush, male    DOB: [birth year]  Age: 33 y.o. MRN: [id number]  Chief Complaint  Patient presents with   New to establish    Patient is establishing as a new patient.  HPI  Discussed the use of AI scribe software for clinical note transcription with the patient, who gave verbal consent to proceed.  History of Present Illness   The patient, with a history of asthma and fatigue managed with B12 supplements, presents with increasing urinary frequency over the past three months. He reports needing to urinate every 20 minutes, even without excessive fluid intake, and waking up two to three times at night to urinate. There is no associated pain, blood in the urine, or flank pain. The patient also reports a persistent sensation of needing to clear his throat, which has been concurrent with the urinary symptoms. He occasionally experiences difficulty swallowing, particularly in the morning and after eating. He has a history of acid reflux and is currently taking over-the-counter allergy medication without significant relief.          01/19/2024    9:09 AM 12/18/2022    8:20 AM 12/18/2022    8:04 AM 10/29/2022    8:55 AM 05/08/2022    9:04 AM  Depression screen PHQ 2/9  Decreased Interest 0 2 0 0 1  Down, Depressed, Hopeless 0 1 1 0 1  PHQ - 2 Score 0 3 1 0 2  Altered sleeping 0 3   2  Tired, decreased energy 1 3   2   Change in appetite 0 2   2  Feeling bad or failure about yourself  0 2   0  Trouble concentrating 0 2   0  Moving slowly or fidgety/restless 0 2   0  Suicidal thoughts 0 0   0  PHQ-9 Score 1 17   8   Difficult doing work/chores Not difficult at all Somewhat difficult   Somewhat difficult         02/19/2022    8:27 AM 04/28/2022    8:09 AM 05/05/2022    6:12 PM 12/18/2022    8:04 AM 01/19/2024    9:09 AM  Fall Risk  Falls in the past year? 0 0  0 0  Was there an injury with Fall?    0 0  Fall Risk Category Calculator    0 0  (RETIRED)  Patient Fall Risk Level Low fall risk Low fall risk Low fall risk    Patient at Risk for Falls Due to    No Fall Risks No Fall Risks  Fall risk Follow up    Falls evaluation completed Falls evaluation completed     Current Outpatient Medications on File Prior to Visit  Medication Sig Dispense Refill   cyanocobalamin (VITAMIN B12) 1000 MCG tablet Take 1 tablet (1,000 mcg total) by mouth daily. 90 tablet 2   No current facility-administered medications on file prior to visit.  . Social History   Socioeconomic History   Marital status: Legally Separated    Spouse name: Not on file   Number of children: Not on file   Years of education: Not on file   Highest education level: Not on file  Occupational History   Not on file  Tobacco Use   Smoking status: Never   Smokeless tobacco: Never  Vaping Use   Vaping status: Never Used  Substance and Sexual Activity   Alcohol use: Yes    Alcohol/week:  2.0 standard drinks of alcohol    Types: 2 Standard drinks or equivalent per week    Comment: Occa   Drug use: No   Sexual activity: Yes    Partners: Male  Other Topics Concern   Not on file  Social History Narrative   Drinks 1-2 cups of caffeine daily.   Social Drivers of Corporate investment banker Strain: Low Risk  (01/19/2024)   Overall Financial Resource Strain (CARDIA)    Difficulty of Paying Living Expenses: Not hard at all  Food Insecurity: No Food Insecurity (01/19/2024)   Hunger Vital Sign    Worried About Running Out of Food in the Last Year: Never true    Ran Out of Food in the Last Year: Never true  Transportation Needs: No Transportation Needs (01/19/2024)   PRAPARE - Administrator, Civil Service (Medical): No    Lack of Transportation (Non-Medical): No  Physical Activity: Insufficiently Active (01/19/2024)   Exercise Vital Sign    Days of Exercise per Week: 3 days    Minutes of Exercise per Session: 30 min  Stress: No Stress Concern Present (01/19/2024)    [employer] of Occupational Health - Occupational Stress Questionnaire    Feeling of Stress : Not at all  Social Connections: Moderately Isolated (01/19/2024)   Social Connection and Isolation Panel [NHANES]    Frequency of Communication with Friends and Family: More than three times a week    Frequency of Social Gatherings with Friends and Family: More than three times a week    Attends Religious Services: More than 4 times per year    Active Member of [employer] or Organizations: No    Attends Engineer, structural: Never    Marital Status: Never married   Past Medical History:  Diagnosis Date   Asthma    Family History  Problem Relation Age of Onset   Healthy Mother     Review of Systems  Constitutional:  Negative for appetite change, fatigue and fever.  HENT:  Positive for trouble swallowing (Throat feels dry like having to clear his throat all the time). Negative for congestion, ear pain, sinus pressure and sore throat.   Respiratory:  Negative for cough, chest tightness, shortness of breath and wheezing.   Cardiovascular:  Negative for chest pain and palpitations.  Gastrointestinal:  Negative for abdominal pain, constipation, diarrhea, nausea and vomiting.  Genitourinary:  Positive for frequency. Negative for dysuria and hematuria.  Musculoskeletal:  Negative for arthralgias, back pain, joint swelling and myalgias.  Skin:  Negative for rash.  Neurological:  Negative for dizziness, weakness and headaches.  Psychiatric/Behavioral:  Negative for dysphoric mood. The patient is not nervous/anxious.      Objective:  BP 110/62 (BP Location: Left Arm, Patient Position: Sitting)   Pulse 86   Temp 97.8 F (36.6 C) (Temporal)   Ht 5' 5.5" (1.664 m)   Wt 168 lb 12.8 oz (76.6 kg)   SpO2 97%   BMI 27.66 kg/m      01/19/2024    9:03 AM 03/30/2023    4:48 PM 12/18/2022    8:05 AM  BP/Weight  Systolic BP 110 126 118  Diastolic BP 62 76 74  Wt. (Lbs) 168.8  157.8  BMI  27.66 kg/m2  25.47 kg/m2    Physical Exam Vitals reviewed.  Constitutional:      Appearance: Normal appearance.  Cardiovascular:     Rate and Rhythm: Normal rate and regular rhythm.  Heart sounds: Normal heart sounds.  Pulmonary:     Effort: Pulmonary effort is normal.     Breath sounds: Normal breath sounds.  Abdominal:     General: Bowel sounds are normal.     Palpations: Abdomen is soft.     Tenderness: There is no abdominal tenderness.  Neurological:     Mental Status: He is alert and oriented to person, place, and time.  Psychiatric:        Mood and Affect: Mood normal.        Behavior: Behavior normal.    Diabetic Foot Exam - Simple   No data filed      Lab Results  Component Value Date   WBC 11.2 (H) 10/29/2022   HGB 15.5 10/29/2022   HCT 46.0 10/29/2022   PLT 300.0 10/29/2022   GLUCOSE 93 10/29/2022   CHOL 168 12/13/2018   TRIG 56.0 12/13/2018   HDL 43.80 12/13/2018   LDLCALC 113 (H) 12/13/2018   ALT 13 10/29/2022   AST 16 10/29/2022   NA 138 10/29/2022   K 3.9 10/29/2022   CL 99 10/29/2022   CREATININE 0.95 10/29/2022   BUN 11 10/29/2022   CO2 30 10/29/2022   TSH 3.520 05/05/2022      Assessment & Plan:   Urinary frequency Assessment & Plan: Urinary frequency for three months with nocturia. Differential includes diabetes, thyroid issues, and prostate problems. Urinalysis negative. Family history of diabetes and thyroid issues. Discussed PSA test implications. - Order lab work for diabetes and thyroid screening. - Order PSA test for prostate evaluation. - Consider medication for urinary frequency if labs are normal.  Orders: -     POCT URINALYSIS DIP (CLINITEK) -     T4, free -     TSH -     PSA  Gastroesophageal reflux disease, unspecified whether esophagitis present Assessment & Plan: Symptoms consistent with acid reflux. Discussed omeprazole  as treatment, noting long-term use may affect vitamin absorption. - Prescribe omeprazole   once daily, 30 minutes before eating or at night. - Educate on potential side effects of omeprazole , including vitamin absorption issues.  Orders: -     Comprehensive metabolic panel with GFR -     CBC with Differential/Platelet -     Hemoglobin A1c -     Lipid panel -     Omeprazole ; Take 1 capsule (40 mg total) by mouth daily.  Dispense: 30 capsule; Refill: 3  B12 deficiency Assessment & Plan: History of somnolence Has been taking B12  Denies any abnormal symptoms Labs drawn today Will adjust treatment depending on symptoms   Orders: -     Vitamin B12 -     VITAMIN D  25 Hydroxy (Vit-D Deficiency, Fractures)  Depression with anxiety Assessment & Plan: Controlled Not currently taking medicine Denies any new or worsening symptoms Continue to monitor for any changes Will adjust treatment depending on symptoms   Migraine with aura and without status migrainosus, not intractable Assessment & Plan: Denies any worsening or changing symptoms Admits to having migraines about once a year that last 2 days Will continue to monitor symptoms Will adjust treatment depending on symptoms   OAB (overactive bladder) Assessment & Plan: Urinary frequency for three months with nocturia. Differential includes diabetes, thyroid issues, and prostate problems. Urinalysis negative. Family history of diabetes and thyroid issues. Discussed PSA test implications. - Order lab work for diabetes and thyroid screening. - Order PSA test for prostate evaluation. - Consider medication for urinary frequency if labs  are normal.   Mild intermittent asthma without complication Assessment & Plan: Controlled Takes OTC allergy medicine that helps control it Denies any changes or worsening symptoms Will continue to monitor          Meds ordered this encounter  Medications   omeprazole  (PRILOSEC) 40 MG capsule    Sig: Take 1 capsule (40 mg total) by mouth daily.    Dispense:  30 capsule     Refill:  3     Orders Placed This Encounter  Procedures   Comprehensive metabolic panel with GFR   CBC with Differential/Platelet   Hemoglobin A1c   Lipid panel   T4, free   TSH   PSA   Vitamin B12   VITAMIN D  25 Hydroxy (Vit-D Deficiency, Fractures)   POCT URINALYSIS DIP (CLINITEK)      I,Lauren M Auman,acting as a scribe for [employer], PA.,have documented all relevant documentation on the behalf of Odilia Bennett, PA,as directed by  Odilia Bennett, PA while in the presence of Odilia Bennett, [place].    Follow-up: Return in about 3 months (around 04/19/2024) for Chronic, Mirian Ames.  AVS was given to patient prior to departure.  Odilia Bennett, [place] [facility] [phone]

## 2024-01-19 NOTE — Assessment & Plan Note (Signed)
 Denies any worsening or changing symptoms Admits to having migraines about once a year that last 2 days Will continue to monitor symptoms Will adjust treatment depending on symptoms

## 2024-01-19 NOTE — Assessment & Plan Note (Signed)
 Controlled Not currently taking medicine Denies any new or worsening symptoms Continue to monitor for any changes Will adjust treatment depending on symptoms

## 2024-01-20 LAB — T4, FREE: Free T4: 1.16 ng/dL (ref 0.82–1.77)

## 2024-01-20 LAB — TSH: TSH: 1.96 u[IU]/mL (ref 0.450–4.500)

## 2024-01-20 LAB — CBC WITH DIFFERENTIAL/PLATELET
Basophils Absolute: 0 10*3/uL (ref 0.0–0.2)
Basos: 0 %
EOS (ABSOLUTE): 0.1 10*3/uL (ref 0.0–0.4)
Eos: 1 %
Hematocrit: 51.4 % — ABNORMAL HIGH (ref 37.5–51.0)
Hemoglobin: 16.7 g/dL (ref 13.0–17.7)
Immature Grans (Abs): 0 10*3/uL (ref 0.0–0.1)
Immature Granulocytes: 0 %
Lymphocytes Absolute: 2.8 10*3/uL (ref 0.7–3.1)
Lymphs: 37 %
MCH: 29.6 pg (ref 26.6–33.0)
MCHC: 32.5 g/dL (ref 31.5–35.7)
MCV: 91 fL (ref 79–97)
Monocytes Absolute: 0.6 10*3/uL (ref 0.1–0.9)
Monocytes: 8 %
Neutrophils Absolute: 4.1 10*3/uL (ref 1.4–7.0)
Neutrophils: 54 %
Platelets: 253 10*3/uL (ref 150–450)
RBC: 5.64 x10E6/uL (ref 4.14–5.80)
RDW: 12.7 % (ref 11.6–15.4)
WBC: 7.5 10*3/uL (ref 3.4–10.8)

## 2024-01-20 LAB — COMPREHENSIVE METABOLIC PANEL WITH GFR
ALT: 20 IU/L (ref 0–44)
AST: 21 IU/L (ref 0–40)
Albumin: 5 g/dL (ref 4.1–5.1)
Alkaline Phosphatase: 102 IU/L (ref 44–121)
BUN/Creatinine Ratio: 15 (ref 9–20)
BUN: 15 mg/dL (ref 6–20)
Bilirubin Total: 0.8 mg/dL (ref 0.0–1.2)
CO2: 22 mmol/L (ref 20–29)
Calcium: 10.1 mg/dL (ref 8.7–10.2)
Chloride: 102 mmol/L (ref 96–106)
Creatinine, Ser: 0.97 mg/dL (ref 0.76–1.27)
Globulin, Total: 2.6 g/dL (ref 1.5–4.5)
Glucose: 92 mg/dL (ref 70–99)
Potassium: 4.4 mmol/L (ref 3.5–5.2)
Sodium: 141 mmol/L (ref 134–144)
Total Protein: 7.6 g/dL (ref 6.0–8.5)
eGFR: 104 mL/min/{1.73_m2} (ref >59)

## 2024-01-20 LAB — HEMOGLOBIN A1C
Est. average glucose Bld gHb Est-mCnc: 105 mg/dL
Hgb A1c MFr Bld: 5.3 % (ref 4.8–5.6)

## 2024-01-20 LAB — LIPID PANEL
Chol/HDL Ratio: 5.7 ratio — ABNORMAL HIGH (ref 0.0–5.0)
Cholesterol, Total: 189 mg/dL (ref 100–199)
HDL: 33 mg/dL — ABNORMAL LOW (ref >39)
LDL Chol Calc (NIH): 110 mg/dL — ABNORMAL HIGH (ref 0–99)
Triglycerides: 268 mg/dL — ABNORMAL HIGH (ref 0–149)
VLDL Cholesterol Cal: 46 mg/dL — ABNORMAL HIGH (ref 5–40)

## 2024-01-20 LAB — VITAMIN D 25 HYDROXY (VIT D DEFICIENCY, FRACTURES): Vit D, 25-Hydroxy: 22.6 ng/mL — ABNORMAL LOW (ref 30.0–100.0)

## 2024-01-20 LAB — PSA: Prostate Specific Ag, Serum: 1.7 ng/mL (ref 0.0–4.0)

## 2024-01-20 LAB — VITAMIN B12: Vitamin B-12: 578 pg/mL (ref 232–1245)

## 2024-01-25 ENCOUNTER — Encounter: Payer: Self-pay | Admitting: Family Medicine

## 2024-01-25 ENCOUNTER — Other Ambulatory Visit: Payer: Self-pay

## 2024-01-25 MED ORDER — VITAMIN D (ERGOCALCIFEROL) 1.25 MG (50000 UNIT) PO CAPS: 50000.0000 [IU] | ORAL_CAPSULE | ORAL | 1 refills | Status: DC

## 2024-03-07 ENCOUNTER — Ambulatory Visit: Admitting: Podiatry

## 2024-03-07 DIAGNOSIS — B351 Tinea unguium: Secondary | ICD-10-CM | POA: Diagnosis not present

## 2024-03-07 DIAGNOSIS — B353 Tinea pedis: Secondary | ICD-10-CM | POA: Diagnosis not present

## 2024-03-07 MED ORDER — CLOTRIMAZOLE-BETAMETHASONE 1-0.05 % EX CREA: 1.0000 | TOPICAL_CREAM | Freq: Every day | CUTANEOUS | 0 refills | Status: AC

## 2024-03-07 NOTE — Patient Instructions (Signed)

## 2024-03-07 NOTE — Progress Notes (Signed)
 Subjective: Chief Complaint  Patient presents with   Nail Problem    Rm 13 Patient is here for onychomycosis of the nails. Pt. states taking Lamisil  (more than a year ago) with no relief.   33 year old male presents the office today with above concerns.  He states that he was on Lamisil  but he did not feel it was helping.  He says nails are the same.  No swelling or redness or any drainage that he reports.  Occasional discomfort of the nails on the right foot.  He does not report any history of psoriasis or other dermatological issues. He has tried changing shoes, cleaning them regularly. His feet do itch at times.    Objective: AAO x3, NAD DP/PT pulses palpable bilaterally, CRT less than 3 seconds Nails are all hypertrophic, dystrophic with yellow, brown discoloration some white discoloration is noted as well.  There is subungual debris present mostly on the right hallux toenail.  There is no edema, erythema or signs of infection.  On the left foot there is interdigital skin present on the first, fourth interspace.  No open lesions. No pain with calf compression, swelling, warmth, erythema  Assessment: Onychodystrophy/onychomycosis with tinea pedis  Plan: -All treatment options discussed with the patient including all alternatives, risks, complications.  -Previously on Lamisil  without any improvement.  Debrided the nails with any complications avoidance of this for culture, pathology to [facility] labs.  -Lotrisone for tinea pedis -Patient encouraged to call the office with any questions, concerns, change in symptoms.   Charity Conch DPM

## 2024-03-08 ENCOUNTER — Other Ambulatory Visit: Payer: Self-pay | Admitting: Physician Assistant

## 2024-03-15 ENCOUNTER — Other Ambulatory Visit: Payer: Self-pay | Admitting: Podiatry

## 2024-03-15 ENCOUNTER — Ambulatory Visit: Payer: Self-pay | Admitting: Podiatry

## 2024-03-16 ENCOUNTER — Other Ambulatory Visit: Payer: Self-pay | Admitting: Podiatry

## 2024-03-16 DIAGNOSIS — Z79899 Other long term (current) drug therapy: Secondary | ICD-10-CM

## 2024-03-16 MED ORDER — EFINACONAZOLE 10 % EX SOLN: 1.0000 [drp] | Freq: Every day | CUTANEOUS | 11 refills | Status: AC

## 2024-03-16 MED ORDER — ITRACONAZOLE 100 MG PO CAPS: ORAL_CAPSULE | ORAL | 0 refills | Status: AC

## 2024-04-05 ENCOUNTER — Ambulatory Visit: Admitting: Physician Assistant

## 2024-04-05 ENCOUNTER — Ambulatory Visit: Payer: Self-pay

## 2024-04-05 ENCOUNTER — Ambulatory Visit: Payer: Self-pay | Admitting: Physician Assistant

## 2024-04-05 ENCOUNTER — Encounter: Payer: Self-pay | Admitting: Physician Assistant

## 2024-04-05 ENCOUNTER — Ambulatory Visit (INDEPENDENT_AMBULATORY_CARE_PROVIDER_SITE_OTHER)
Admission: RE | Admit: 2024-04-05 | Discharge: 2024-04-05 | Disposition: A | Discharge status: Home / Self Care | Source: Ambulatory Visit | Attending: Physician Assistant | Admitting: Physician Assistant

## 2024-04-05 VITALS — BP 122/82 | HR 112 | Temp 99.9°F | Ht 65.5 in | Wt 166.0 lb

## 2024-04-05 DIAGNOSIS — J189 Pneumonia, unspecified organism: Secondary | ICD-10-CM

## 2024-04-05 DIAGNOSIS — K219 Gastro-esophageal reflux disease without esophagitis: Secondary | ICD-10-CM

## 2024-04-05 DIAGNOSIS — R0602 Shortness of breath: Secondary | ICD-10-CM

## 2024-04-05 DIAGNOSIS — R059 Cough, unspecified: Secondary | ICD-10-CM

## 2024-04-05 MED ORDER — CEFTRIAXONE SODIUM 1 G IJ SOLR: 1.0000 g | Freq: Once | INTRAMUSCULAR | Status: DC

## 2024-04-05 MED ORDER — HYDROCOD POLI-CHLORPHE POLI ER 10-8 MG/5ML PO SUER: 5.0000 mL | Freq: Two times a day (BID) | ORAL | 0 refills | Status: DC | PRN

## 2024-04-05 MED ORDER — OMEPRAZOLE 40 MG PO CPDR: 40.0000 mg | DELAYED_RELEASE_CAPSULE | Freq: Every day | ORAL | 3 refills | Status: AC

## 2024-04-05 MED ORDER — TRIAMCINOLONE ACETONIDE 40 MG/ML IJ SUSP
80.0000 mg | Freq: Once | INTRAMUSCULAR | Status: AC
Start: 2024-04-05 — End: 2024-04-05
  Administered 2024-04-05: 80 mg via INTRAMUSCULAR

## 2024-04-05 MED ORDER — AMOXICILLIN-POT CLAVULANATE 875-125 MG PO TABS: 1.0000 | ORAL_TABLET | Freq: Two times a day (BID) | ORAL | 0 refills | Status: DC

## 2024-04-05 NOTE — Telephone Encounter (Signed)
 FYI Only or Action Required?: FYI only for provider.  Patient was last seen in primary care on 01/19/2024 by Paul Cleaves, PA.  Called Nurse Triage reporting Cough.  Symptoms began several days ago.  Interventions attempted: OTC medications: mucinex and Prescription medications: cefdinir.  Symptoms are: gradually worsening.  Triage Disposition: See HCP Within 4 Hours (Or PCP Triage)  Patient/caregiver understands and will follow disposition?: Yes      Copied from CRM [id number]. Topic: Clinical - Red Word Triage >> Apr 05, 2024  9:06 AM Emylou G wrote: [facility] that prompted transfer to Nurse Triage: Low grade fever 99.5-101, sore throat, head congestion, bad cough.. was seen in [facility] Saturday - said he had infection ( was given antibiotic cefdinir 300 mg capsule ) Reason for Disposition  [1] MILD difficulty breathing (e.g., minimal/no SOB at rest, SOB with walking, pulse <100) AND [2] still present when not coughing  Answer Assessment - Initial Assessment Questions 1. ONSET: When did the cough begin?      Thursday 2. SEVERITY: How bad is the cough today?      mod 3. SPUTUM: Describe the color of your sputum (none, dry cough; clear, white, yellow, green)     clear 4. HEMOPTYSIS: Are you coughing up any blood? If so ask: How much? (flecks, streaks, tablespoons, etc.)     no 5. DIFFICULTY BREATHING: Are you having difficulty breathing? If Yes, ask: How bad is it? (e.g., mild, moderate, severe)    - MILD: No SOB at rest, mild SOB with walking, speaks normally in sentences, can lie down, no retractions, pulse < 100.    - MODERATE: SOB at rest, SOB with minimal exertion and prefers to sit, cannot lie down flat, speaks in phrases, mild retractions, audible wheezing, pulse 100-120.    - SEVERE: Very SOB at rest, speaks in single words, struggling to breathe, sitting hunched forward, retractions, pulse > 120      mild 6. FEVER: Do you have a fever? If Yes, ask: What is your  temperature, how was it measured, and when did it start?     99.7 this morning, oral 7. CARDIAC HISTORY: Do you have any history of heart disease? (e.g., heart attack, congestive heart failure)      no 8. LUNG HISTORY: Do you have any history of lung disease?  (e.g., pulmonary embolus, asthma, emphysema)     asthma 9. PE RISK FACTORS: Do you have a history of blood clots? (or: recent major surgery, recent prolonged travel, bedridden)     no 10. OTHER SYMPTOMS: Do you have any other symptoms? (e.g., runny nose, wheezing, chest pain)       Sore throat, nasal congestion and runny nose  Protocols used: Cough - Acute Productive-A-AH

## 2024-04-05 NOTE — Progress Notes (Signed)
 Acute Office Visit  Subjective:    Patient ID: Paul Rush, male    DOB: 03-05-1991, 33 y.o.   MRN: [id number]  Chief Complaint  Patient presents with   Cough/fever    HPI: Patient is in today for Worsening cough Discussed the use of AI scribe software for clinical note transcription with the patient, who gave verbal consent to proceed.  History of Present Illness   Paul Rush is a 33 year old male who presents with worsening cough and shortness of breath.  He has been experiencing a worsening cough and shortness of breath. Initially, he sought care at an urgent care facility where he was prescribed an unspecified medication, which has not improved his symptoms. The cough is productive of clear mucus.  He reports experiencing fever, with the highest recorded temperature being 101F, and consistently around 99.1F to 99.63F. No other family members are sick at home.  He has been taking Cefdinir twice a day as prescribed by the urgent care, but it has not been effective in alleviating his symptoms. He also mentions using Prilosec for heartburn, which has been effective, but he has run out of it.  He has previously used Tessalon  Perles for cough without relief and prefers Tussionex, which he finds effective for his symptoms.  No allergies to medications and no issues with prednisone  or similar medications.       Past Medical History:  Diagnosis Date   Asthma     Past Surgical History:  Procedure Laterality Date   CHOLECYSTECTOMY     WISDOM TOOTH EXTRACTION     WISDOM TOOTH EXTRACTION      Family History  Problem Relation Age of Onset   Healthy Mother     Social History   Socioeconomic History   Marital status: Legally Separated    Spouse name: Not on file   Number of children: Not on file   Years of education: Not on file   Highest education level: Not on file  Occupational History   Not on file  Tobacco Use   Smoking status: Never   Smokeless  tobacco: Never  Vaping Use   Vaping status: Never Used  Substance and Sexual Activity   Alcohol use: Yes    Alcohol/week: 2.0 standard drinks of alcohol    Types: 2 Standard drinks or equivalent per week    Comment: Occa   Drug use: No   Sexual activity: Yes    Partners: Male  Other Topics Concern   Not on file  Social History Narrative   Drinks 1-2 cups of caffeine daily.   Social Drivers of Corporate investment banker Strain: Low Risk  (01/19/2024)   Overall Financial Resource Strain (CARDIA)    Difficulty of Paying Living Expenses: Not hard at all  Food Insecurity: No Food Insecurity (01/19/2024)   Hunger Vital Sign    Worried About Running Out of Food in the Last Year: Never true    Ran Out of Food in the Last Year: Never true  Transportation Needs: No Transportation Needs (01/19/2024)   PRAPARE - Administrator, Civil Service (Medical): No    Lack of Transportation (Non-Medical): No  Physical Activity: Insufficiently Active (01/19/2024)   Exercise Vital Sign    Days of Exercise per Week: 3 days    Minutes of Exercise per Session: 30 min  Stress: No Stress Concern Present (01/19/2024)   [employer] of Occupational Health - Occupational Stress Questionnaire  Feeling of Stress : Not at all  Social Connections: Moderately Isolated (01/19/2024)   Social Connection and Isolation Panel    Frequency of Communication with Friends and Family: More than three times a week    Frequency of Social Gatherings with Friends and Family: More than three times a week    Attends Religious Services: More than 4 times per year    Active Member of [employer] or Organizations: No    Attends Banker Meetings: Never    Marital Status: Never married  Intimate Partner Violence: Not At Risk (01/19/2024)   Humiliation, Afraid, Rape, and Kick questionnaire    Fear of Current or Ex-Partner: No    Emotionally Abused: No    Physically Abused: No    Sexually Abused: No     Outpatient Medications Prior to Visit  Medication Sig Dispense Refill   cefdinir (OMNICEF) 300 MG capsule Take 300 mg by mouth 2 (two) times daily.     clotrimazole -betamethasone  (LOTRISONE ) cream Apply 1 Application topically daily. 30 g 0   cyanocobalamin (VITAMIN B12) 1000 MCG tablet Take 1 tablet (1,000 mcg total) by mouth daily. 90 tablet 2   Efinaconazole  10 % SOLN Apply 1 drop topically daily. 4 mL 11   itraconazole  (SPORANOX ) 100 MG capsule Take 200mg  twice a day for one week, no medication for 3 weeks then repeat for 3 months 84 capsule 0   Vitamin D , Ergocalciferol , (DRISDOL ) 1.25 MG (50000 UNIT) CAPS capsule TAKE 1 CAPSULE BY MOUTH EVERY 7 DAYS 5 capsule 3   omeprazole  (PRILOSEC) 40 MG capsule Take 1 capsule (40 mg total) by mouth daily. 30 capsule 3   No facility-administered medications prior to visit.    No Known Allergies  Review of Systems  Constitutional:  Positive for fever. Negative for appetite change and fatigue.  HENT:  Positive for congestion and sore throat. Negative for ear pain and sinus pressure.   Respiratory:  Positive for cough and shortness of breath. Negative for chest tightness and wheezing.   Cardiovascular:  Negative for chest pain and palpitations.  Gastrointestinal:  Negative for abdominal pain, constipation, diarrhea, nausea and vomiting.  Genitourinary:  Negative for dysuria and hematuria.  Musculoskeletal:  Negative for arthralgias, back pain, joint swelling and myalgias.  Skin:  Negative for rash.  Neurological:  Negative for dizziness, weakness and headaches.  Psychiatric/Behavioral:  Negative for dysphoric mood. The patient is not nervous/anxious.        Objective:        04/05/2024   11:44 AM 01/19/2024    9:03 AM 03/30/2023    4:48 PM  Vitals with BMI  Height 5' 5.5 5' 5.5   Weight 166 lbs 168 lbs 13 oz   BMI 27.19 27.65   Systolic 122 110 873  Diastolic 82 62 76  Pulse 112 86 75    No data found.    Physical  Exam Vitals reviewed.  Constitutional:      Appearance: He is ill-appearing.  Cardiovascular:     Rate and Rhythm: Regular rhythm. Tachycardia present.     Heart sounds: Normal heart sounds.  Pulmonary:     Effort: Pulmonary effort is normal.     Breath sounds: Rhonchi present.  Abdominal:     General: Bowel sounds are normal.     Palpations: Abdomen is soft.     Tenderness: There is no abdominal tenderness.  Neurological:     Mental Status: He is alert and oriented to person, place, and time.  Psychiatric:        Mood and Affect: Mood normal.        Behavior: Behavior normal.     Health Maintenance Due  Topic Date Due   Hepatitis C Screening  Never done   Pneumococcal Vaccine 62-64 Years old (1 of 2 - PCV) Never done   Hepatitis B Vaccines (1 of 3 - 19+ 3-dose series) Never done   HPV VACCINES (1 - 3-dose SCDM series) Never done       Topic Date Due   Hepatitis B Vaccines (1 of 3 - 19+ 3-dose series) Never done   HPV VACCINES (1 - 3-dose SCDM series) Never done     Lab Results  Component Value Date   TSH 1.960 01/19/2024   Lab Results  Component Value Date   WBC 7.5 01/19/2024   HGB 16.7 01/19/2024   HCT 51.4 (H) 01/19/2024   MCV 91 01/19/2024   PLT 253 01/19/2024   Lab Results  Component Value Date   NA 141 01/19/2024   K 4.4 01/19/2024   CO2 22 01/19/2024   GLUCOSE 92 01/19/2024   BUN 15 01/19/2024   CREATININE 0.97 01/19/2024   BILITOT 0.8 01/19/2024   ALKPHOS 102 01/19/2024   AST 21 01/19/2024   ALT 20 01/19/2024   PROT 7.6 01/19/2024   ALBUMIN 5.0 01/19/2024   CALCIUM 10.1 01/19/2024   ANIONGAP 14 04/06/2018   EGFR 104 01/19/2024   GFR 104.85 10/29/2022   Lab Results  Component Value Date   CHOL 189 01/19/2024   Lab Results  Component Value Date   HDL 33 (L) 01/19/2024   Lab Results  Component Value Date   LDLCALC 110 (H) 01/19/2024   Lab Results  Component Value Date   TRIG 268 (H) 01/19/2024   Lab Results  Component Value  Date   CHOLHDL 5.7 (H) 01/19/2024   Lab Results  Component Value Date   HGBA1C 5.3 01/19/2024       Assessment & Plan:  Pneumonia of right lower lobe due to infectious organism Assessment & Plan: Worsening cough and shortness of breath with fever and diminished right lung sounds suggest possible pneumonia or severe bronchitis. - Administered Kenalog  injection for inflammation. - Administered Rosadan injection for potential infection. - Ordered stat chest x-ray to evaluate for pneumonia. - Prescribed Tussinex for cough. - Continue Septinir unless x-ray indicates need for additional antibiotic. - Advised emergency care for severe symptoms.  Orders: -     Triamcinolone  Acetonide -     Hydrocod Poli-Chlorphe Poli ER; Take 5 mLs by mouth every 12 (twelve) hours as needed for cough (severe cough).  Dispense: 115 mL; Refill: 0 -     DG Chest 2 View; Future  Gastroesophageal reflux disease, unspecified whether esophagitis present Assessment & Plan: Ran out of Prilosec, previously effective for heartburn. - Prescribed Prilosec.   Orders: -     Omeprazole ; Take 1 capsule (40 mg total) by mouth daily.  Dispense: 90 capsule; Refill: 3         Meds ordered this encounter  Medications   DISCONTD: cefTRIAXone  (ROCEPHIN ) injection 1 g   triamcinolone  acetonide (KENALOG -40) injection 80 mg   chlorpheniramine-HYDROcodone (TUSSIONEX) 10-8 MG/5ML    Sig: Take 5 mLs by mouth every 12 (twelve) hours as needed for cough (severe cough).    Dispense:  115 mL    Refill:  0   omeprazole  (PRILOSEC) 40 MG capsule    Sig: Take 1 capsule (40 mg total)  by mouth daily.    Dispense:  90 capsule    Refill:  3    Orders Placed This Encounter  Procedures   DG Chest 2 View     Follow-up: No follow-ups on file.  An After Visit Summary was printed and given to the patient.   I,Lauren M Auman,acting as a Neurosurgeon for [employer], PA.,have documented all relevant documentation on the behalf of Nola Angles, PA,as directed by  Nola Angles, PA while in the presence of Nola Angles, [place].    Nola Angles, [place] [facility] [phone]

## 2024-04-10 DIAGNOSIS — J189 Pneumonia, unspecified organism: Secondary | ICD-10-CM | POA: Insufficient documentation

## 2024-04-10 NOTE — Assessment & Plan Note (Signed)
 Worsening cough and shortness of breath with fever and diminished right lung sounds suggest possible pneumonia or severe bronchitis. - Administered Kenalog  injection for inflammation. - Administered Rosadan injection for potential infection. - Ordered stat chest x-ray to evaluate for pneumonia. - Prescribed Tussinex for cough. - Continue Septinir unless x-ray indicates need for additional antibiotic. - Advised emergency care for severe symptoms.

## 2024-04-10 NOTE — Assessment & Plan Note (Signed)
 Ran out of Prilosec, previously effective for heartburn. - Prescribed Prilosec.

## 2024-04-14 LAB — CBC WITH DIFFERENTIAL/PLATELET
Basophils Absolute: 0 x10E3/uL (ref 0.0–0.2)
Basos: 0 %
EOS (ABSOLUTE): 0.1 x10E3/uL (ref 0.0–0.4)
Eos: 1 %
Hematocrit: 46.5 % (ref 37.5–51.0)
Hemoglobin: 15.3 g/dL (ref 13.0–17.7)
Immature Grans (Abs): 0 x10E3/uL (ref 0.0–0.1)
Immature Granulocytes: 0 %
Lymphocytes Absolute: 3 x10E3/uL (ref 0.7–3.1)
Lymphs: 32 %
MCH: 29.8 pg (ref 26.6–33.0)
MCHC: 32.9 g/dL (ref 31.5–35.7)
MCV: 91 fL (ref 79–97)
Monocytes Absolute: 0.6 x10E3/uL (ref 0.1–0.9)
Monocytes: 7 %
Neutrophils Absolute: 5.5 x10E3/uL (ref 1.4–7.0)
Neutrophils: 60 %
Platelets: 329 x10E3/uL (ref 150–450)
RBC: 5.14 x10E6/uL (ref 4.14–5.80)
RDW: 13 % (ref 11.6–15.4)
WBC: 9.3 x10E3/uL (ref 3.4–10.8)

## 2024-04-14 LAB — HEPATIC FUNCTION PANEL
ALT: 21 IU/L (ref 0–44)
AST: 20 IU/L (ref 0–40)
Albumin: 4.6 g/dL (ref 4.1–5.1)
Alkaline Phosphatase: 122 IU/L — ABNORMAL HIGH (ref 44–121)
Bilirubin Total: 0.8 mg/dL (ref 0.0–1.2)
Bilirubin, Direct: 0.23 mg/dL (ref 0.00–0.40)
Total Protein: 7.1 g/dL (ref 6.0–8.5)

## 2024-04-15 ENCOUNTER — Other Ambulatory Visit: Payer: Self-pay | Admitting: Podiatry

## 2024-04-15 ENCOUNTER — Ambulatory Visit: Payer: Self-pay | Admitting: Podiatry

## 2024-04-15 DIAGNOSIS — R899 Unspecified abnormal finding in specimens from other organs, systems and tissues: Secondary | ICD-10-CM

## 2024-04-19 NOTE — Progress Notes (Unsigned)
 Subjective:  Patient ID: Paul Rush, male    DOB: 1990/10/15  Age: 33 y.o. MRN: [id number]  No chief complaint on file.   HPI:     01/19/2024    9:09 AM 12/18/2022    8:20 AM 12/18/2022    8:04 AM 10/29/2022    8:55 AM 05/08/2022    9:04 AM  Depression screen PHQ 2/9  Decreased Interest 0 2 0 0 1  Down, Depressed, Hopeless 0 1 1 0 1  PHQ - 2 Score 0 3 1 0 2  Altered sleeping 0 3   2  Tired, decreased energy 1 3   2   Change in appetite 0 2   2  Feeling bad or failure about yourself  0 2   0  Trouble concentrating 0 2   0  Moving slowly or fidgety/restless 0 2   0  Suicidal thoughts 0 0   0  PHQ-9 Score 1 17   8   Difficult doing work/chores Not difficult at all Somewhat difficult   Somewhat difficult        01/19/2024    9:09 AM  Fall Risk   Falls in the past year? 0  Number falls in past yr: 0  Injury with Fall? 0  Risk for fall due to : No Fall Risks  Follow up Falls evaluation completed    Patient Care Team: Milon Cleaves, [place] as PCP - General (Physician Assistant)   Review of Systems  Constitutional:  Negative for appetite change, fatigue and fever.  HENT:  Negative for congestion, ear pain, sinus pressure and sore throat.   Respiratory:  Negative for cough, chest tightness, shortness of breath and wheezing.   Cardiovascular:  Negative for chest pain and palpitations.  Gastrointestinal:  Negative for abdominal pain, constipation, diarrhea, nausea and vomiting.  Genitourinary:  Negative for dysuria and hematuria.  Musculoskeletal:  Negative for arthralgias, back pain, joint swelling and myalgias.  Skin:  Negative for rash.  Neurological:  Negative for dizziness, weakness and headaches.  Psychiatric/Behavioral:  Negative for dysphoric mood. The patient is not nervous/anxious.     Current Outpatient Medications on File Prior to Visit  Medication Sig Dispense Refill   amoxicillin -clavulanate (AUGMENTIN ) 875-125 MG tablet Take 1 tablet by mouth 2 (two) times  daily. 14 tablet 0   cefdinir (OMNICEF) 300 MG capsule Take 300 mg by mouth 2 (two) times daily.     chlorpheniramine-HYDROcodone (TUSSIONEX) 10-8 MG/5ML Take 5 mLs by mouth every 12 (twelve) hours as needed for cough (severe cough). 115 mL 0   clotrimazole -betamethasone  (LOTRISONE ) cream Apply 1 Application topically daily. 30 g 0   cyanocobalamin (VITAMIN B12) 1000 MCG tablet Take 1 tablet (1,000 mcg total) by mouth daily. 90 tablet 2   Efinaconazole  10 % SOLN Apply 1 drop topically daily. 4 mL 11   itraconazole  (SPORANOX ) 100 MG capsule Take 200mg  twice a day for one week, no medication for 3 weeks then repeat for 3 months 84 capsule 0   omeprazole  (PRILOSEC) 40 MG capsule Take 1 capsule (40 mg total) by mouth daily. 90 capsule 3   Vitamin D , Ergocalciferol , (DRISDOL ) 1.25 MG (50000 UNIT) CAPS capsule TAKE 1 CAPSULE BY MOUTH EVERY 7 DAYS 5 capsule 3   No current facility-administered medications on file prior to visit.   Past Medical History:  Diagnosis Date   Asthma    Past Surgical History:  Procedure Laterality Date   CHOLECYSTECTOMY     WISDOM TOOTH EXTRACTION  WISDOM TOOTH EXTRACTION      Family History  Problem Relation Age of Onset   Healthy Mother    Social History   Socioeconomic History   Marital status: Legally Separated    Spouse name: Not on file   Number of children: Not on file   Years of education: Not on file   Highest education level: Not on file  Occupational History   Not on file  Tobacco Use   Smoking status: Never   Smokeless tobacco: Never  Vaping Use   Vaping status: Never Used  Substance and Sexual Activity   Alcohol use: Yes    Alcohol/week: 2.0 standard drinks of alcohol    Types: 2 Standard drinks or equivalent per week    Comment: Occa   Drug use: No   Sexual activity: Yes    Partners: Male  Other Topics Concern   Not on file  Social History Narrative   Drinks 1-2 cups of caffeine daily.   Social Drivers of Research scientist (physical sciences) Strain: Low Risk  (01/19/2024)   Overall Financial Resource Strain (CARDIA)    Difficulty of Paying Living Expenses: Not hard at all  Food Insecurity: No Food Insecurity (01/19/2024)   Hunger Vital Sign    Worried About Running Out of Food in the Last Year: Never true    Ran Out of Food in the Last Year: Never true  Transportation Needs: No Transportation Needs (01/19/2024)   PRAPARE - Administrator, Civil Service (Medical): No    Lack of Transportation (Non-Medical): No  Physical Activity: Insufficiently Active (01/19/2024)   Exercise Vital Sign    Days of Exercise per Week: 3 days    Minutes of Exercise per Session: 30 min  Stress: No Stress Concern Present (01/19/2024)   [employer] of Occupational Health - Occupational Stress Questionnaire    Feeling of Stress : Not at all  Social Connections: Moderately Isolated (01/19/2024)   Social Connection and Isolation Panel    Frequency of Communication with Friends and Family: More than three times a week    Frequency of Social Gatherings with Friends and Family: More than three times a week    Attends Religious Services: More than 4 times per year    Active Member of [employer] or Organizations: No    Attends Banker Meetings: Never    Marital Status: Never married    Objective:  There were no vitals taken for this visit.     04/05/2024   11:44 AM 01/19/2024    9:03 AM 03/30/2023    4:48 PM  BP/Weight  Systolic BP 122 110 126  Diastolic BP 82 62 76  Wt. (Lbs) 166 168.8   BMI 27.2 kg/m2 27.66 kg/m2     Physical Exam  {Perform Simple Foot Exam  Perform Detailed exam:1} {Insert foot Exam (Optional):30965}   Lab Results  Component Value Date   WBC 9.3 04/13/2024   HGB 15.3 04/13/2024   HCT 46.5 04/13/2024   PLT 329 04/13/2024   GLUCOSE 92 01/19/2024   CHOL 189 01/19/2024   TRIG 268 (H) 01/19/2024   HDL 33 (L) 01/19/2024   LDLCALC 110 (H) 01/19/2024   ALT 21 04/13/2024   AST 20 04/13/2024    NA 141 01/19/2024   K 4.4 01/19/2024   CL 102 01/19/2024   CREATININE 0.97 01/19/2024   BUN 15 01/19/2024   CO2 22 01/19/2024   TSH 1.960 01/19/2024   HGBA1C 5.3 01/19/2024  Assessment & Plan:  Migraine with aura and without status migrainosus, not intractable  OAB (overactive bladder)  Mild intermittent asthma without complication  Gastroesophageal reflux disease, unspecified whether esophagitis present  B12 deficiency  Depression with anxiety     No orders of the defined types were placed in this encounter.   No orders of the defined types were placed in this encounter.    Follow-up: No follow-ups on file.   I,Lauren M Auman,acting as a Neurosurgeon for [employer], PA.,have documented all relevant documentation on the behalf of Nola Angles, PA,as directed by  Nola Angles, PA while in the presence of Nola Angles, [place].   An After Visit Summary was printed and given to the patient.  Nola Angles, [place] [facility] [phone]

## 2024-04-20 ENCOUNTER — Encounter: Payer: Self-pay | Admitting: Physician Assistant

## 2024-04-20 ENCOUNTER — Ambulatory Visit: Admitting: Physician Assistant

## 2024-04-20 VITALS — BP 128/66 | HR 72 | Temp 97.8°F | Resp 16 | Ht 65.5 in | Wt 163.0 lb

## 2024-04-20 DIAGNOSIS — G43109 Migraine with aura, not intractable, without status migrainosus: Secondary | ICD-10-CM | POA: Diagnosis not present

## 2024-04-20 DIAGNOSIS — J4520 Mild intermittent asthma, uncomplicated: Secondary | ICD-10-CM

## 2024-04-20 DIAGNOSIS — N3281 Overactive bladder: Secondary | ICD-10-CM

## 2024-04-20 DIAGNOSIS — E782 Mixed hyperlipidemia: Secondary | ICD-10-CM | POA: Insufficient documentation

## 2024-04-20 DIAGNOSIS — E559 Vitamin D deficiency, unspecified: Secondary | ICD-10-CM | POA: Insufficient documentation

## 2024-04-20 DIAGNOSIS — F418 Other specified anxiety disorders: Secondary | ICD-10-CM

## 2024-04-20 DIAGNOSIS — K219 Gastro-esophageal reflux disease without esophagitis: Secondary | ICD-10-CM

## 2024-04-20 NOTE — Assessment & Plan Note (Signed)
 Controlled Takes OTC allergy medicine that helps control it Denies any changes or worsening symptoms Will continue to monitor

## 2024-04-20 NOTE — Assessment & Plan Note (Signed)
 Controlled Not currently taking medicine Denies any new or worsening symptoms Continue to monitor for any changes Will adjust treatment depending on symptoms

## 2024-04-20 NOTE — Assessment & Plan Note (Signed)
 Vitamin D  levels monitored. Supplementation may continue for six months if low normal. - Check vitamin D  levels. - Continue supplementation for six months if low normal. - Discontinue supplementation if levels too high.

## 2024-04-20 NOTE — Assessment & Plan Note (Signed)
 Denies any worsening or changing symptoms Admits to having migraines about once a year that last 2 days Will continue to monitor symptoms Will adjust treatment depending on symptoms

## 2024-04-20 NOTE — Assessment & Plan Note (Signed)
 Cholesterol reduction needed to prevent cardiovascular issues. A1c normal at 5.3. Liver function monitored due to antifungal use. - Monitor cholesterol levels. - Monitor liver function tests. - Follow-up in six months, adjust to three months if cholesterol changes drastically. Lab Results  Component Value Date   LDLCALC 110 (H) 01/19/2024

## 2024-04-20 NOTE — Patient Instructions (Signed)
 VISIT SUMMARY:  You came in today for a follow-up regarding your cholesterol management. You mentioned feeling significantly better since your last visit. We reviewed your recent lab results, including your A1c level, which is within the normal range. We also discussed your liver function, which is being monitored due to recent antifungal medication use. You reported a light residual cough but no significant symptoms otherwise. You are currently taking vitamin D  supplements.  YOUR PLAN:  -HYPERLIPIDEMIA: Hyperlipidemia means you have high levels of cholesterol in your blood, which can increase your risk of heart disease. Your A1c level is normal at 5.3, and we are monitoring your liver function due to your recent antifungal medication use. We will continue to monitor your cholesterol levels and liver function tests. Please follow up in six months, or sooner if there are significant changes in your cholesterol levels.  -VITAMIN D  DEFICIENCY: Vitamin D  deficiency means you have lower than normal levels of vitamin D , which is important for bone health. We will check your vitamin D  levels and continue supplementation for six months if your levels are low normal. If your levels are too high, we will discontinue the supplementation.  INSTRUCTIONS:  Please follow up in six months for cholesterol and liver function monitoring, or sooner if there are significant changes in your cholesterol levels. Continue taking your vitamin D  supplements as advised, and we will recheck your vitamin D  levels in six months.

## 2024-04-20 NOTE — Assessment & Plan Note (Signed)
 Denies any worsening or changing in symptoms Continue to take prilosec 40mg  as prescribed Will adjust treatment based on symptoms

## 2024-04-21 LAB — CBC WITH DIFFERENTIAL/PLATELET
Basophils Absolute: 0 x10E3/uL (ref 0.0–0.2)
Basos: 0 %
EOS (ABSOLUTE): 0.1 x10E3/uL (ref 0.0–0.4)
Eos: 1 %
Hematocrit: 47.7 % (ref 37.5–51.0)
Hemoglobin: 15.8 g/dL (ref 13.0–17.7)
Immature Grans (Abs): 0 x10E3/uL (ref 0.0–0.1)
Immature Granulocytes: 0 %
Lymphocytes Absolute: 2.9 x10E3/uL (ref 0.7–3.1)
Lymphs: 35 %
MCH: 30 pg (ref 26.6–33.0)
MCHC: 33.1 g/dL (ref 31.5–35.7)
MCV: 91 fL (ref 79–97)
Monocytes Absolute: 0.5 x10E3/uL (ref 0.1–0.9)
Monocytes: 6 %
Neutrophils Absolute: 4.8 x10E3/uL (ref 1.4–7.0)
Neutrophils: 58 %
Platelets: 306 x10E3/uL (ref 150–450)
RBC: 5.26 x10E6/uL (ref 4.14–5.80)
RDW: 13.1 % (ref 11.6–15.4)
WBC: 8.3 x10E3/uL (ref 3.4–10.8)

## 2024-04-21 LAB — COMPREHENSIVE METABOLIC PANEL WITH GFR
ALT: 20 IU/L (ref 0–44)
AST: 20 IU/L (ref 0–40)
Albumin: 4.9 g/dL (ref 4.1–5.1)
Alkaline Phosphatase: 114 IU/L (ref 44–121)
BUN/Creatinine Ratio: 14 (ref 9–20)
BUN: 15 mg/dL (ref 6–20)
Bilirubin Total: 1.4 mg/dL — ABNORMAL HIGH (ref 0.0–1.2)
CO2: 22 mmol/L (ref 20–29)
Calcium: 9.9 mg/dL (ref 8.7–10.2)
Chloride: 103 mmol/L (ref 96–106)
Creatinine, Ser: 1.07 mg/dL (ref 0.76–1.27)
Globulin, Total: 2.7 g/dL (ref 1.5–4.5)
Glucose: 89 mg/dL (ref 70–99)
Potassium: 4.5 mmol/L (ref 3.5–5.2)
Sodium: 141 mmol/L (ref 134–144)
Total Protein: 7.6 g/dL (ref 6.0–8.5)
eGFR: 93 mL/min/1.73 (ref >59)

## 2024-04-21 LAB — LIPID PANEL
Chol/HDL Ratio: 5.2 ratio — ABNORMAL HIGH (ref 0.0–5.0)
Cholesterol, Total: 173 mg/dL (ref 100–199)
HDL: 33 mg/dL — ABNORMAL LOW (ref >39)
LDL Chol Calc (NIH): 117 mg/dL — ABNORMAL HIGH (ref 0–99)
Triglycerides: 126 mg/dL (ref 0–149)
VLDL Cholesterol Cal: 23 mg/dL (ref 5–40)

## 2024-04-21 LAB — VITAMIN D 25 HYDROXY (VIT D DEFICIENCY, FRACTURES): Vit D, 25-Hydroxy: 33.8 ng/mL (ref 30.0–100.0)

## 2024-04-22 ENCOUNTER — Ambulatory Visit: Payer: Self-pay | Admitting: Physician Assistant

## 2024-06-03 ENCOUNTER — Ambulatory Visit (HOSPITAL_BASED_OUTPATIENT_CLINIC_OR_DEPARTMENT_OTHER)
Admission: EM | Admit: 2024-06-03 | Discharge: 2024-06-03 | Disposition: A | Discharge status: Home / Self Care | Attending: Family Medicine | Admitting: Family Medicine

## 2024-06-03 ENCOUNTER — Encounter (HOSPITAL_BASED_OUTPATIENT_CLINIC_OR_DEPARTMENT_OTHER): Payer: Self-pay | Admitting: Emergency Medicine

## 2024-06-03 DIAGNOSIS — W57XXXA Bitten or stung by nonvenomous insect and other nonvenomous arthropods, initial encounter: Secondary | ICD-10-CM

## 2024-06-03 DIAGNOSIS — Z91038 Other insect allergy status: Secondary | ICD-10-CM | POA: Diagnosis not present

## 2024-06-03 DIAGNOSIS — S30860A Insect bite (nonvenomous) of lower back and pelvis, initial encounter: Secondary | ICD-10-CM | POA: Diagnosis not present

## 2024-06-03 DIAGNOSIS — S70361A Insect bite (nonvenomous), right thigh, initial encounter: Secondary | ICD-10-CM

## 2024-06-03 MED ORDER — TRIAMCINOLONE ACETONIDE 40 MG/ML IJ SUSP
40.0000 mg | Freq: Once | INTRAMUSCULAR | Status: AC
  Administered 2024-06-03: 40 mg via INTRAMUSCULAR

## 2024-06-03 MED ORDER — CETIRIZINE HCL 10 MG PO TABS: 10.0000 mg | ORAL_TABLET | Freq: Every day | ORAL | 0 refills | Status: AC | PRN

## 2024-06-03 NOTE — ED Triage Notes (Addendum)
 Pt was sitting on the front porch and something bite his right hip area and leg pt reports swelling to area, warm to touch. Pt did take Benadryl last night and today.

## 2024-06-03 NOTE — ED Provider Notes (Signed)
 [facility]    CSN: [id number] Arrival date & time: 06/03/24  1536      History   Chief Complaint Chief Complaint  Patient presents with   Insect Bite    HPI Paul Rush is a 33 y.o. male.   33 year old male here with complaint of insect bites that occurred on the evening of 06/02/2024.  He has 2 on his right upper thigh and 1 on his right upper buttock.  He said swelling and redness with warmth at the sites.  1 on the buttock is a really big reaction.  He has taken Benadryl last night and again this morning with not much results.  He denies shortness of breath or difficulty breathing but there is some itching at the sites.     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 Authorizing Provider  cetirizine  (ZYRTEC ) 10 MG tablet Take 1 tablet (10 mg total) by mouth daily as needed for allergies. 06/03/24  Yes Ival Domino, FNP  itraconazole   (SPORANOX ) 100 MG capsule Take 200mg  twice a day for one week, no medication for 3 weeks then repeat for 3 months 03/16/24  Yes Gershon Donnice SAUNDERS, DPM  clotrimazole -betamethasone  (LOTRISONE ) cream Apply 1 Application topically daily. 03/07/24   Gershon Donnice SAUNDERS, DPM  cyanocobalamin (VITAMIN B12) 1000 MCG tablet Take 1 tablet (1,000 mcg total) by mouth daily. 05/09/22   Berneta Elsie Sayre, MD  Efinaconazole  10 % SOLN Apply 1 drop topically daily. 03/16/24   Gershon Donnice SAUNDERS, DPM  omeprazole  (PRILOSEC) 40 MG capsule Take 1 capsule (40 mg total) by mouth daily. 04/05/24   Milon Cleaves, PA  Vitamin D , Ergocalciferol , (DRISDOL ) 1.25 MG (50000 UNIT) CAPS capsule TAKE 1 CAPSULE BY MOUTH EVERY 7 DAYS 03/08/24   Milon Cleaves, PA    Family History Family History  Problem Relation Age of Onset   Healthy Mother     Social History Social History   Tobacco Use   Smoking status: Never   Smokeless tobacco: Never  Vaping Use   Vaping status: Never Used  Substance Use Topics   Alcohol use: Yes    Alcohol/week: 2.0 standard drinks of alcohol    Types: 2 Standard drinks or equivalent per week    Comment: Occa   Drug use: No     Allergies   Patient has no known allergies.   Review of Systems Review of Systems  Constitutional:  Negative for  fever.  Respiratory:  Negative for cough.   Cardiovascular:  Negative for chest pain.  Gastrointestinal:  Negative for abdominal pain, constipation, diarrhea, nausea and vomiting.  Musculoskeletal:  Negative for arthralgias and back pain.  Skin:  Positive for wound (Insect bites on right thigh and right buttock with local reaction). Negative for color change and rash.  Neurological:  Negative for syncope.  All other systems reviewed and are negative.    Physical Exam Triage Vital Signs ED Triage Vitals  Encounter Vitals Group     BP 06/03/24 1625 119/76     Girls Systolic BP Percentile --      Girls Diastolic BP Percentile --      Boys Systolic  BP Percentile --      Boys Diastolic BP Percentile --      Pulse Rate 06/03/24 1625 80     Resp 06/03/24 1625 18     Temp 06/03/24 1625 98.3 F (36.8 C)     Temp Source 06/03/24 1625 Oral     SpO2 06/03/24 1625 96 %     Weight --      Height --      Head Circumference --      Peak Flow --      Pain Score 06/03/24 1624 4     Pain Loc --      Pain Education --      Exclude from Growth Chart --    No data found.  Updated Vital Signs BP 119/76 (BP Location: Right Arm)   Pulse 80   Temp 98.3 F (36.8 C) (Oral)   Resp 18   SpO2 96%   Visual Acuity Right Eye Distance:   Left Eye Distance:   Bilateral Distance:    Right Eye Near:   Left Eye Near:    Bilateral Near:     Physical Exam Vitals and nursing note reviewed.  Constitutional:      General: He is not in acute distress.    Appearance: He is well-developed. He is not ill-appearing or toxic-appearing.  HENT:     Head: Normocephalic and atraumatic.     Right Ear: External ear normal.     Left Ear: External ear normal.     Nose: Nose normal.     Mouth/Throat:     Lips: Pink.     Mouth: Mucous membranes are moist.  Eyes:     Conjunctiva/sclera: Conjunctivae normal.     Pupils: Pupils are equal, round, and reactive to light.  Cardiovascular:     Rate and Rhythm: Normal rate and regular rhythm.     Heart sounds: S1 normal and S2 normal. No murmur heard. Pulmonary:     Effort: Pulmonary effort is normal. No respiratory distress.     Breath sounds: Normal breath sounds. No decreased breath sounds, wheezing, rhonchi or rales.  Musculoskeletal:        General: No swelling.  Skin:    General: Skin is warm and dry.     Capillary Refill: Capillary refill takes less than 2 seconds.     Findings: Wound (2 insect bites on right thigh and 1 on right upper buttock with localized reactions.  Right buttock reaction is 3 times the size of the thigh reactions.  See photo for more information.) present. No rash.  Neurological:      Mental Status: He is alert and oriented to person, place, and time.  Psychiatric:        Mood and Affect: Mood normal.  UC Treatments / Results  Labs (all labs ordered are listed, but only abnormal results are displayed) Labs Reviewed - No data to display  EKG   Radiology No results found.  Procedures Procedures (including critical care time)  Medications Ordered in UC Medications  triamcinolone  acetonide (KENALOG -40) injection 40 mg (40 mg Intramuscular Given 06/03/24 1656)    Initial Impression / Assessment and Plan / UC Course  I have reviewed the triage vital signs and the nursing notes.  Pertinent labs & imaging results that were available during my care of the patient were reviewed by me and considered in my medical decision making (see chart for details).  Plan of Care: Insect bites with local reaction: Kenalog  40 mg injection now.  Cetirizine  10 mg once or twice daily as needed for the rash and itching.  Follow-up if symptoms do not improve, worsen or new symptoms occur.  I reviewed the plan of care with the patient and/or the patient's guardian.  The patient and/or guardian had time to ask questions and acknowledged that the questions were answered.  I provided instruction on symptoms or reasons to return here or to go to an ER, if symptoms/condition did not improve, worsened or if new symptoms occurred.  Final Clinical Impressions(s) / UC Diagnoses   Final diagnoses:  Insect bite of buttock with local reaction, initial encounter  Insect bite of right thigh, initial encounter  Allergic reaction to insect bite     Discharge Instructions      Allergic reaction to insect bites on the thigh and buttock: Kenalog  40 mg injection now (this is a steroid) cetirizine  10 mg, 1 pill once or twice daily as needed for allergic reactions and any itching.  Follow-up if symptoms do not improve, worsen or new symptoms occur.     ED Prescriptions      Medication Sig Dispense Auth. Provider   cetirizine  (ZYRTEC ) 10 MG tablet Take 1 tablet (10 mg total) by mouth daily as needed for allergies. 30 tablet Jolisa Intriago, FNP      PDMP not reviewed this encounter.   Ival Domino, FNP 06/03/24 1659

## 2024-06-03 NOTE — Discharge Instructions (Signed)
 Allergic reaction to insect bites on the thigh and buttock: Kenalog  40 mg injection now (this is a steroid) cetirizine  10 mg, 1 pill once or twice daily as needed for allergic reactions and any itching.  Follow-up if symptoms do not improve, worsen or new symptoms occur.

## 2024-06-06 ENCOUNTER — Ambulatory Visit: Admitting: Physician Assistant

## 2024-06-06 ENCOUNTER — Encounter: Payer: Self-pay | Admitting: Physician Assistant

## 2024-06-06 VITALS — BP 110/72 | HR 92 | Temp 98.3°F | Ht 65.5 in | Wt 160.0 lb

## 2024-06-06 DIAGNOSIS — B0229 Other postherpetic nervous system involvement: Secondary | ICD-10-CM | POA: Diagnosis not present

## 2024-06-06 MED ORDER — ACYCLOVIR 800 MG PO TABS: 800.0000 mg | ORAL_TABLET | Freq: Every day | ORAL | 0 refills | Status: DC

## 2024-06-06 MED ORDER — GABAPENTIN 100 MG PO CAPS: 100.0000 mg | ORAL_CAPSULE | Freq: Two times a day (BID) | ORAL | 0 refills | Status: DC

## 2024-06-06 NOTE — Assessment & Plan Note (Signed)
 Shingles with nervous system involvement Presentation consistent with shingles with vesicular rash following a dermatome and nervous system involvement. Differential diagnosis includes cellulitis, but rash pattern and symptoms support shingles. Possible varicella vaccination history. Stress and recent pneumonia may have contributed to virus activation. Discussed varicella vaccine's role and potential for recurrence. - Prescribed Aciclovir 5 times daily for 7-10 days. Reassess after 7 days; extend to 10 days if symptoms persist. - Prescribed low dose gabapentin  for nerve pain, twice daily as needed, advised nighttime use due to potential fatigue. - Advised calamine lotion for topical itch relief. - Instructed to keep lesions clean and avoid scratching to prevent secondary infection. - Monitor for signs of cellulitis: fever, continuous redness, pus. Consider antibiotics if cellulitis develops.

## 2024-06-06 NOTE — Progress Notes (Signed)
 Subjective:  Patient ID: Paul Rush, male    DOB: [birth year]  Age: 33 y.o. MRN: [id number]  Chief Complaint  Patient presents with   Insect Bite    HPI:  Discussed the use of AI scribe software for clinical note transcription with the patient, who gave verbal consent to proceed.  History of Present Illness Paul Rush is a 33 year old male who presents with a spreading rash and pain following an insect bite.  He developed a rash that began with one lesion on the buttock and two on the leg, which has since spread down the leg and to the lower back. Initially, the rash was itchy, but it progressed to a burning and tingling sensation. The rash has made it difficult for him to lift his leg, describing it as 'dead weight'.  He does not recall feeling an insect bite but noticed the rash after sitting outside in a rocking chair. The rash has not improved with the use of prednisone , which was intended to alleviate itching. He has not experienced similar symptoms in the past.  He denies having had chickenpox in the past and is unsure if he received the shingles vaccine, though he recalls his mother mentioning he had the chickenpox vaccine. No fever, stomach pain, or diarrhea. He reports a history of pneumonia in July, which may have impacted his immune system.  Current medications include attempts with Tylenol  and ibuprofen, which provided minimal relief. He has not tried gabapentin  previously. He is dealing with stress related to caring for his grandfather.        06/06/2024   10:17 AM 04/20/2024    9:08 AM 01/19/2024    9:09 AM 12/18/2022    8:20 AM 12/18/2022    8:04 AM  Depression screen PHQ 2/9  Decreased Interest 0 0 0 2 0  Down, Depressed, Hopeless 0 0 0 1 1  PHQ - 2 Score 0 0 0 3 1  Altered sleeping  1 0 3   Tired, decreased energy  1 1 3    Change in appetite  1 0 2   Feeling bad or failure about yourself   0 0 2   Trouble concentrating  0 0 2   Moving slowly or  fidgety/restless  0 0 2   Suicidal thoughts  0 0 0   PHQ-9 Score  3 1 17    Difficult doing work/chores  Not difficult at all Not difficult at all Somewhat difficult         06/06/2024   10:16 AM  Fall Risk   Falls in the past year? 0  Number falls in past yr: 0  Injury with Fall? 0  Risk for fall due to : No Fall Risks  Follow up Falls evaluation completed    Patient Care Team: Milon Cleaves, [place] as PCP - General (Physician Assistant)   Review of Systems  Constitutional:  Negative for chills, fatigue and fever.  HENT:  Negative for congestion, ear pain, sinus pressure and sore throat.   Respiratory:  Negative for cough and shortness of breath.   Cardiovascular:  Negative for chest pain.  Gastrointestinal:  Negative for abdominal pain, constipation, diarrhea, nausea and vomiting.  Genitourinary:  Negative for dysuria and frequency.  Musculoskeletal:  Negative for arthralgias, back pain and myalgias.  Skin:  Positive for color change and rash.  Neurological:  Negative for dizziness and headaches.  Psychiatric/Behavioral:  Negative for dysphoric mood. The patient is not nervous/anxious.  Current Outpatient Medications on File Prior to Visit  Medication Sig Dispense Refill   cetirizine  (ZYRTEC ) 10 MG tablet Take 1 tablet (10 mg total) by mouth daily as needed for allergies. 30 tablet 0   cyanocobalamin (VITAMIN B12) 1000 MCG tablet Take 1 tablet (1,000 mcg total) by mouth daily. 90 tablet 2   Efinaconazole  10 % SOLN Apply 1 drop topically daily. 4 mL 11   itraconazole  (SPORANOX ) 100 MG capsule Take 200mg  twice a day for one week, no medication for 3 weeks then repeat for 3 months 84 capsule 0   omeprazole  (PRILOSEC) 40 MG capsule Take 1 capsule (40 mg total) by mouth daily. 90 capsule 3   Vitamin D , Ergocalciferol , (DRISDOL ) 1.25 MG (50000 UNIT) CAPS capsule TAKE 1 CAPSULE BY MOUTH EVERY 7 DAYS 5 capsule 3   clotrimazole -betamethasone  (LOTRISONE ) cream Apply 1 Application topically  daily. (Patient not taking: Reported on 06/06/2024) 30 g 0   No current facility-administered medications on file prior to visit.   Past Medical History:  Diagnosis Date   Asthma    Past Surgical History:  Procedure Laterality Date   CHOLECYSTECTOMY     WISDOM TOOTH EXTRACTION     WISDOM TOOTH EXTRACTION      Family History  Problem Relation Age of Onset   Healthy Mother    Social History   Socioeconomic History   Marital status: Legally Separated    Spouse name: Not on file   Number of children: Not on file   Years of education: Not on file   Highest education level: Not on file  Occupational History   Not on file  Tobacco Use   Smoking status: Never   Smokeless tobacco: Never  Vaping Use   Vaping status: Never Used  Substance and Sexual Activity   Alcohol use: Yes    Alcohol/week: 2.0 standard drinks of alcohol    Types: 2 Standard drinks or equivalent per week    Comment: Occa   Drug use: No   Sexual activity: Yes    Partners: Male  Other Topics Concern   Not on file  Social History Narrative   Drinks 1-2 cups of caffeine daily.   Social Drivers of Corporate investment banker Strain: Low Risk  (01/19/2024)   Overall Financial Resource Strain (CARDIA)    Difficulty of Paying Living Expenses: Not hard at all  Food Insecurity: No Food Insecurity (01/19/2024)   Hunger Vital Sign    Worried About Running Out of Food in the Last Year: Never true    Ran Out of Food in the Last Year: Never true  Transportation Needs: No Transportation Needs (01/19/2024)   PRAPARE - Administrator, Civil Service (Medical): No    Lack of Transportation (Non-Medical): No  Physical Activity: Insufficiently Active (01/19/2024)   Exercise Vital Sign    Days of Exercise per Week: 3 days    Minutes of Exercise per Session: 30 min  Stress: No Stress Concern Present (01/19/2024)   [employer] of Occupational Health - Occupational Stress Questionnaire    Feeling of Stress :  Not at all  Social Connections: Moderately Isolated (01/19/2024)   Social Connection and Isolation Panel    Frequency of Communication with Friends and Family: More than three times a week    Frequency of Social Gatherings with Friends and Family: More than three times a week    Attends Religious Services: More than 4 times per year    Active Member of  Clubs or Organizations: No    Attends Banker Meetings: Never    Marital Status: Never married    Objective:  BP 110/72   Pulse 92   Temp 98.3 F (36.8 C)   Ht 5' 5.5 (1.664 m)   Wt 160 lb (72.6 kg)   SpO2 97%   BMI 26.22 kg/m      06/06/2024   10:14 AM 06/03/2024    4:25 PM 04/20/2024    9:05 AM  BP/Weight  Systolic BP 110 119 128  Diastolic BP 72 76 66  Wt. (Lbs) 160  163  BMI 26.22 kg/m2  26.71 kg/m2    Physical Exam Constitutional:      Appearance: Normal appearance.  Cardiovascular:     Rate and Rhythm: Normal rate and regular rhythm.  Pulmonary:     Effort: Pulmonary effort is normal.  Skin:    Findings: Rash present. Rash is purpuric and vesicular.      Neurological:     Mental Status: He is alert.         Lab Results  Component Value Date   WBC 8.3 04/20/2024   HGB 15.8 04/20/2024   HCT 47.7 04/20/2024   PLT 306 04/20/2024   GLUCOSE 89 04/20/2024   CHOL 173 04/20/2024   TRIG 126 04/20/2024   HDL 33 (L) 04/20/2024   LDLCALC 117 (H) 04/20/2024   ALT 20 04/20/2024   AST 20 04/20/2024   NA 141 04/20/2024   K 4.5 04/20/2024   CL 103 04/20/2024   CREATININE 1.07 04/20/2024   BUN 15 04/20/2024   CO2 22 04/20/2024   TSH 1.960 01/19/2024   HGBA1C 5.3 01/19/2024      Assessment & Plan:  Other postherpetic nervous system involvement Assessment & Plan: Shingles with nervous system involvement Presentation consistent with shingles with vesicular rash following a dermatome and nervous system involvement. Differential diagnosis includes cellulitis, but rash pattern and symptoms support  shingles. Possible varicella vaccination history. Stress and recent pneumonia may have contributed to virus activation. Discussed varicella vaccine's role and potential for recurrence. - Prescribed Aciclovir 5 times daily for 7-10 days. Reassess after 7 days; extend to 10 days if symptoms persist. - Prescribed low dose gabapentin  for nerve pain, twice daily as needed, advised nighttime use due to potential fatigue. - Advised calamine lotion for topical itch relief. - Instructed to keep lesions clean and avoid scratching to prevent secondary infection. - Monitor for signs of cellulitis: fever, continuous redness, pus. Consider antibiotics if cellulitis develops.  Orders: -     Acyclovir ; Take 1 tablet (800 mg total) by mouth 5 (five) times daily.  Dispense: 70 tablet; Refill: 0 -     Gabapentin ; Take 1 capsule (100 mg total) by mouth 2 (two) times daily.  Dispense: 20 capsule; Refill: 0      Meds ordered this encounter  Medications   acyclovir  (ZOVIRAX ) 800 MG tablet    Sig: Take 1 tablet (800 mg total) by mouth 5 (five) times daily.    Dispense:  70 tablet    Refill:  0   gabapentin  (NEURONTIN ) 100 MG capsule    Sig: Take 1 capsule (100 mg total) by mouth 2 (two) times daily.    Dispense:  20 capsule    Refill:  0    No orders of the defined types were placed in this encounter.    Follow-up: No follow-ups on file.   I,Candice Gribble,acting as a Neurosurgeon for [employer], [employer].,have documented  all relevant documentation on the behalf of Nola Angles, PA,as directed by  Nola Angles, PA while in the presence of Nola Angles, [place].   An After Visit Summary was printed and given to the patient.  Nola Angles, [place] [facility] [phone]

## 2024-06-13 ENCOUNTER — Other Ambulatory Visit: Payer: Self-pay | Admitting: Physician Assistant

## 2024-06-13 DIAGNOSIS — B0229 Other postherpetic nervous system involvement: Secondary | ICD-10-CM

## 2024-06-24 ENCOUNTER — Other Ambulatory Visit: Payer: Self-pay

## 2024-06-24 DIAGNOSIS — B0229 Other postherpetic nervous system involvement: Secondary | ICD-10-CM

## 2024-07-12 ENCOUNTER — Encounter: Payer: Self-pay | Admitting: Physician Assistant

## 2024-07-12 ENCOUNTER — Ambulatory Visit: Admitting: Physician Assistant

## 2024-07-12 VITALS — BP 118/88 | HR 78 | Temp 97.8°F | Ht 65.5 in | Wt 157.0 lb

## 2024-07-12 DIAGNOSIS — H8111 Benign paroxysmal vertigo, right ear: Secondary | ICD-10-CM | POA: Diagnosis not present

## 2024-07-12 MED ORDER — MECLIZINE HCL 25 MG PO TABS: 25.0000 mg | ORAL_TABLET | Freq: Three times a day (TID) | ORAL | 0 refills | Status: AC | PRN

## 2024-07-12 NOTE — Progress Notes (Signed)
 Acute Office Visit  Subjective:    Patient ID: Paul Rush, male    DOB: 1990-12-08, 33 y.o.   MRN: [id number]  Chief Complaint  Patient presents with   Dizziness since having shingles    HPI: Patient is in today for dizziness and vertigo like symptoms.  Assessment and Plan Assessment & Plan Benign paroxysmal positional vertigo Intermittent dizziness and lightheadedness with head movements, consistent with BPPV. Likely due to inner ear crystal displacement, possibly secondary to previous shingles infection. - Performed Epley maneuver in-office, instructed on home performance. - Prescribed meclizine  for symptomatic relief, up to three times daily as needed. - Advised continued hydration. - Consider ENT referral if symptoms persist or worsen by next week. - Educated on Teaching laboratory technician, provided home practice resources. - Sent meclizine  prescription to [facility] in [place].  Recording duration: 17 minutes     Past Medical History:  Diagnosis Date   Asthma     Past Surgical History:  Procedure Laterality Date   CHOLECYSTECTOMY     WISDOM TOOTH EXTRACTION     WISDOM TOOTH EXTRACTION      Family History  Problem Relation Age of Onset   Healthy Mother     Social History   Socioeconomic History   Marital status: Legally Separated    Spouse name: Not on file   Number of children: Not on file   Years of education: Not on file   Highest education level: Not on file  Occupational History   Not on file  Tobacco Use   Smoking status: Never   Smokeless tobacco: Never  Vaping Use   Vaping status: Never Used  Substance and Sexual Activity   Alcohol use: Yes    Alcohol/week: 2.0 standard drinks of alcohol    Types: 2 Standard drinks or equivalent per week    Comment: Occa   Drug use: No   Sexual activity: Yes    Partners: Male  Other Topics Concern   Not on file  Social History Narrative   Drinks 1-2 cups of caffeine daily.   Social Drivers of Manufacturing engineer Strain: Low Risk  (01/19/2024)   Overall Financial Resource Strain (CARDIA)    Difficulty of Paying Living Expenses: Not hard at all  Food Insecurity: No Food Insecurity (01/19/2024)   Hunger Vital Sign    Worried About Running Out of Food in the Last Year: Never true    Ran Out of Food in the Last Year: Never true  Transportation Needs: No Transportation Needs (01/19/2024)   PRAPARE - Administrator, Civil Service (Medical): No    Lack of Transportation (Non-Medical): No  Physical Activity: Insufficiently Active (01/19/2024)   Exercise Vital Sign    Days of Exercise per Week: 3 days    Minutes of Exercise per Session: 30 min  Stress: No Stress Concern Present (01/19/2024)   [employer] of Occupational Health - Occupational Stress Questionnaire    Feeling of Stress : Not at all  Social Connections: Moderately Isolated (01/19/2024)   Social Connection and Isolation Panel    Frequency of Communication with Friends and Family: More than three times a week    Frequency of Social Gatherings with Friends and Family: More than three times a week    Attends Religious Services: More than 4 times per year    Active Member of [employer] or Organizations: No    Attends Banker Meetings: Never    Marital Status: Never married  Intimate Partner Violence: Not At Risk (01/19/2024)   Humiliation, Afraid, Rape, and Kick questionnaire    Fear of Current or Ex-Partner: No    Emotionally Abused: No    Physically Abused: No    Sexually Abused: No    Outpatient Medications Prior to Visit  Medication Sig Dispense Refill   cetirizine  (ZYRTEC ) 10 MG tablet Take 1 tablet (10 mg total) by mouth daily as needed for allergies. 30 tablet 0   clotrimazole -betamethasone  (LOTRISONE ) cream Apply 1 Application topically daily. 30 g 0   cyanocobalamin (VITAMIN B12) 1000 MCG tablet Take 1 tablet (1,000 mcg total) by mouth daily. 90 tablet 2   Efinaconazole  10 % SOLN Apply 1  drop topically daily. 4 mL 11   itraconazole  (SPORANOX ) 100 MG capsule Take 200mg  twice a day for one week, no medication for 3 weeks then repeat for 3 months 84 capsule 0   omeprazole  (PRILOSEC) 40 MG capsule Take 1 capsule (40 mg total) by mouth daily. 90 capsule 3   Vitamin D , Ergocalciferol , (DRISDOL ) 1.25 MG (50000 UNIT) CAPS capsule TAKE 1 CAPSULE BY MOUTH EVERY 7 DAYS 5 capsule 3   acyclovir  (ZOVIRAX ) 800 MG tablet Take 1 tablet (800 mg total) by mouth 5 (five) times daily. 70 tablet 0   gabapentin  (NEURONTIN ) 100 MG capsule TAKE 1 CAPSULE(100 MG) BY MOUTH TWICE DAILY 20 capsule 0   No facility-administered medications prior to visit.    No Known Allergies  Review of Systems  Constitutional:  Negative for appetite change, fatigue and fever.  HENT:  Negative for congestion, ear pain, sinus pressure and sore throat.   Respiratory:  Negative for cough, chest tightness, shortness of breath and wheezing.   Cardiovascular:  Negative for chest pain and palpitations.  Gastrointestinal:  Negative for abdominal pain, constipation, diarrhea, nausea and vomiting.  Genitourinary:  Negative for dysuria and hematuria.  Musculoskeletal:  Negative for arthralgias, back pain, joint swelling and myalgias.  Skin:  Negative for rash.  Neurological:  Positive for dizziness. Negative for weakness and headaches.  Psychiatric/Behavioral:  Negative for dysphoric mood. The patient is not nervous/anxious.        Objective:        07/12/2024    9:24 AM 06/06/2024   10:14 AM 06/03/2024    4:25 PM  Vitals with BMI  Height 5' 5.5 5' 5.5   Weight 157 lbs 160 lbs   BMI 25.72 26.21   Systolic 118 110 880  Diastolic 88 72 76  Pulse 78 92 80    Orthostatic VS for the past 72 hrs (Last 3 readings):  Patient Position BP Location  07/12/24 0924 Sitting Left Arm     Physical Exam Constitutional:      Appearance: Normal appearance. He is not ill-appearing.  HENT:     Head: Normocephalic and  atraumatic.  Musculoskeletal:     Cervical back: Normal range of motion. No rigidity or tenderness.  Neurological:     Mental Status: He is alert.     Health Maintenance Due  Topic Date Due   Hepatitis C Screening  Never done   Pneumococcal Vaccine (1 of 2 - PCV) Never done   Hepatitis B Vaccines 19-59 Average Risk (1 of 3 - 19+ 3-dose series) Never done   Influenza Vaccine  04/29/2024       Topic Date Due   Hepatitis B Vaccines 19-59 Average Risk (1 of 3 - 19+ 3-dose series) Never done     Lab Results  Component  Value Date   TSH 1.960 01/19/2024   Lab Results  Component Value Date   WBC 8.3 04/20/2024   HGB 15.8 04/20/2024   HCT 47.7 04/20/2024   MCV 91 04/20/2024   PLT 306 04/20/2024   Lab Results  Component Value Date   NA 141 04/20/2024   K 4.5 04/20/2024   CO2 22 04/20/2024   GLUCOSE 89 04/20/2024   BUN 15 04/20/2024   CREATININE 1.07 04/20/2024   BILITOT 1.4 (H) 04/20/2024   ALKPHOS 114 04/20/2024   AST 20 04/20/2024   ALT 20 04/20/2024   PROT 7.6 04/20/2024   ALBUMIN 4.9 04/20/2024   CALCIUM 9.9 04/20/2024   ANIONGAP 14 04/06/2018   EGFR 93 04/20/2024   GFR 104.85 10/29/2022   Lab Results  Component Value Date   CHOL 173 04/20/2024   Lab Results  Component Value Date   HDL 33 (L) 04/20/2024   Lab Results  Component Value Date   LDLCALC 117 (H) 04/20/2024   Lab Results  Component Value Date   TRIG 126 04/20/2024   Lab Results  Component Value Date   CHOLHDL 5.2 (H) 04/20/2024   Lab Results  Component Value Date   HGBA1C 5.3 01/19/2024        Results for orders placed or performed in visit on 04/20/24  CBC with Differential/Platelet   Collection Time: 04/20/24  9:39 AM  Result Value Ref Range   WBC 8.3 3.4 - 10.8 x10E3/uL   RBC 5.26 4.14 - 5.80 x10E6/uL   Hemoglobin 15.8 13.0 - 17.7 g/dL   Hematocrit 52.2 62.4 - 51.0 %   MCV 91 79 - 97 fL   MCH 30.0 26.6 - 33.0 pg   MCHC 33.1 31.5 - 35.7 g/dL   RDW 86.8 88.3 - 84.5 %    Platelets 306 150 - 450 x10E3/uL   Neutrophils 58 Not Estab. %   Lymphs 35 Not Estab. %   Monocytes 6 Not Estab. %   Eos 1 Not Estab. %   Basos 0 Not Estab. %   Neutrophils Absolute 4.8 1.4 - 7.0 x10E3/uL   Lymphocytes Absolute 2.9 0.7 - 3.1 x10E3/uL   Monocytes Absolute 0.5 0.1 - 0.9 x10E3/uL   EOS (ABSOLUTE) 0.1 0.0 - 0.4 x10E3/uL   Basophils Absolute 0.0 0.0 - 0.2 x10E3/uL   Immature Granulocytes 0 Not Estab. %   Immature Grans (Abs) 0.0 0.0 - 0.1 x10E3/uL  Comprehensive metabolic panel with GFR   Collection Time: 04/20/24  9:39 AM  Result Value Ref Range   Glucose 89 70 - 99 mg/dL   BUN 15 6 - 20 mg/dL   Creatinine, Ser 8.92 0.76 - 1.27 mg/dL   eGFR 93 >40 fO/fpw/8.26   BUN/Creatinine Ratio 14 9 - 20   Sodium 141 134 - 144 mmol/L   Potassium 4.5 3.5 - 5.2 mmol/L   Chloride 103 96 - 106 mmol/L   CO2 22 20 - 29 mmol/L   Calcium 9.9 8.7 - 10.2 mg/dL   Total Protein 7.6 6.0 - 8.5 g/dL   Albumin 4.9 4.1 - 5.1 g/dL   Globulin, Total 2.7 1.5 - 4.5 g/dL   Bilirubin Total 1.4 (H) 0.0 - 1.2 mg/dL   Alkaline Phosphatase 114 44 - 121 IU/L   AST 20 0 - 40 IU/L   ALT 20 0 - 44 IU/L  Lipid panel   Collection Time: 04/20/24  9:39 AM  Result Value Ref Range   Cholesterol, Total 173 100 - 199 mg/dL  Triglycerides 126 0 - 149 mg/dL   HDL 33 (L) >60 mg/dL   VLDL Cholesterol Cal 23 5 - 40 mg/dL   LDL Chol Calc (NIH) 882 (H) 0 - 99 mg/dL   Chol/HDL Ratio 5.2 (H) 0.0 - 5.0 ratio  VITAMIN D  25 Hydroxy (Vit-D Deficiency, Fractures)   Collection Time: 04/20/24  9:39 AM  Result Value Ref Range   Vit D, 25-Hydroxy 33.8 30.0 - 100.0 ng/mL   Total time spent on today's visit was 20 minutes, including both face-to-face time and nonface-to-face time personally spent on review of chart (labs and imaging), discussing labs and goals, discussing further work-up, treatment options, referrals to specialist if needed, reviewing outside records of pertinent, answering patient's questions, and coordinating  care.   Assessment & Plan:   Assessment & Plan Benign paroxysmal positional vertigo of right ear Benign paroxysmal positional vertigo Intermittent dizziness and lightheadedness with head movements, consistent with BPPV. Likely due to inner ear crystal displacement, possibly secondary to previous shingles infection. - Performed Epley maneuver in-office, instructed on home performance. - Prescribed meclizine  for symptomatic relief, up to three times daily as needed. - Advised continued hydration. - Consider ENT referral if symptoms persist or worsen by next week. - Educated on Teaching laboratory technician, provided home practice resources. - Sent meclizine  prescription to [facility] in [place]. Orders:   meclizine  (ANTIVERT ) 25 MG tablet; Take 1 tablet (25 mg total) by mouth 3 (three) times daily as needed for dizziness.     Body mass index is 25.73 kg/m.SABRA   No orders of the defined types were placed in this encounter.   No orders of the defined types were placed in this encounter.    Follow-up: No follow-ups on file.  An After Visit Summary was printed and given to the patient.   I,Lauren M Auman,acting as a Neurosurgeon for [employer], PA.,have documented all relevant documentation on the behalf of Nola Angles, PA,as directed by  Nola Angles, PA while in the presence of Nola Angles, [place].    Nola Angles, [place] [facility] [phone]

## 2024-07-19 ENCOUNTER — Ambulatory Visit: Admitting: Physician Assistant

## 2024-07-19 ENCOUNTER — Encounter: Payer: Self-pay | Admitting: Physician Assistant

## 2024-07-19 VITALS — BP 118/78 | HR 64 | Temp 97.8°F | Ht 65.5 in | Wt 156.8 lb

## 2024-07-19 DIAGNOSIS — H8111 Benign paroxysmal vertigo, right ear: Secondary | ICD-10-CM | POA: Diagnosis not present

## 2024-07-19 DIAGNOSIS — H60391 Other infective otitis externa, right ear: Secondary | ICD-10-CM | POA: Diagnosis not present

## 2024-07-19 MED ORDER — PREDNISONE 20 MG PO TABS: ORAL_TABLET | ORAL | 0 refills | Status: AC

## 2024-07-19 MED ORDER — OFLOXACIN 0.3 % OT SOLN: 5.0000 [drp] | Freq: Every day | OTIC | 0 refills | Status: AC

## 2024-07-19 NOTE — Progress Notes (Signed)
 Acute Office Visit  Subjective:    Patient ID: Paul Rush, male    DOB: 1990/12/03, 33 y.o.   MRN: [id number]  Chief Complaint  Patient presents with   Still Dizzy    HPI: Patient is in today for vertigo  Discussed the use of AI scribe software for clinical note transcription with the patient, who gave verbal consent to proceed.  History of Present Illness Paul Rush is a 33 year old male who presents with continued vertigo and right ear pain.  He experiences ongoing dizziness and vertigo, with a new symptom of tingling and sharp pain in his right ear. The pain is intermittent, occurring two to three times a day, but he can go a day without experiencing it. It is described as quick and does not linger. Meclizine  has provided some relief for his dizziness.  No recent sinus drainage, fullness in the ear, or recent swimming activities. No recent fevers. No recent allergies or sinus issues.  He is currently not on any antibiotics but has been using meclizine  for dizziness.       Past Medical History:  Diagnosis Date   Asthma     Past Surgical History:  Procedure Laterality Date   CHOLECYSTECTOMY     WISDOM TOOTH EXTRACTION     WISDOM TOOTH EXTRACTION      Family History  Problem Relation Age of Onset   Healthy Mother     Social History   Socioeconomic History   Marital status: Legally Separated    Spouse name: Not on file   Number of children: Not on file   Years of education: Not on file   Highest education level: Not on file  Occupational History   Not on file  Tobacco Use   Smoking status: Never   Smokeless tobacco: Never  Vaping Use   Vaping status: Never Used  Substance and Sexual Activity   Alcohol use: Yes    Alcohol/week: 2.0 standard drinks of alcohol    Types: 2 Standard drinks or equivalent per week    Comment: Occa   Drug use: No   Sexual activity: Yes    Partners: Male  Other Topics Concern   Not on file  Social  History Narrative   Drinks 1-2 cups of caffeine daily.   Social Drivers of Corporate Investment Banker Strain: Low Risk  (01/19/2024)   Overall Financial Resource Strain (CARDIA)    Difficulty of Paying Living Expenses: Not hard at all  Food Insecurity: No Food Insecurity (01/19/2024)   Hunger Vital Sign    Worried About Running Out of Food in the Last Year: Never true    Ran Out of Food in the Last Year: Never true  Transportation Needs: No Transportation Needs (01/19/2024)   PRAPARE - Administrator, Civil Service (Medical): No    Lack of Transportation (Non-Medical): No  Physical Activity: Insufficiently Active (01/19/2024)   Exercise Vital Sign    Days of Exercise per Week: 3 days    Minutes of Exercise per Session: 30 min  Stress: No Stress Concern Present (01/19/2024)   [employer] of Occupational Health - Occupational Stress Questionnaire    Feeling of Stress : Not at all  Social Connections: Moderately Isolated (01/19/2024)   Social Connection and Isolation Panel    Frequency of Communication with Friends and Family: More than three times a week    Frequency of Social Gatherings with Friends and Family: More than three times  a week    Attends Religious Services: More than 4 times per year    Active Member of Clubs or Organizations: No    Attends Banker Meetings: Never    Marital Status: Never married  Intimate Partner Violence: Not At Risk (01/19/2024)   Humiliation, Afraid, Rape, and Kick questionnaire    Fear of Current or Ex-Partner: No    Emotionally Abused: No    Physically Abused: No    Sexually Abused: No    Outpatient Medications Prior to Visit  Medication Sig Dispense Refill   cetirizine  (ZYRTEC ) 10 MG tablet Take 1 tablet (10 mg total) by mouth daily as needed for allergies. 30 tablet 0   clotrimazole -betamethasone  (LOTRISONE ) cream Apply 1 Application topically daily. 30 g 0   cyanocobalamin (VITAMIN B12) 1000 MCG tablet Take 1  tablet (1,000 mcg total) by mouth daily. 90 tablet 2   Efinaconazole  10 % SOLN Apply 1 drop topically daily. 4 mL 11   itraconazole  (SPORANOX ) 100 MG capsule Take 200mg  twice a day for one week, no medication for 3 weeks then repeat for 3 months 84 capsule 0   meclizine  (ANTIVERT ) 25 MG tablet Take 1 tablet (25 mg total) by mouth 3 (three) times daily as needed for dizziness. 30 tablet 0   omeprazole  (PRILOSEC) 40 MG capsule Take 1 capsule (40 mg total) by mouth daily. 90 capsule 3   venlafaxine  XR (EFFEXOR -XR) 150 MG 24 hr capsule Take 150 mg by mouth daily with breakfast.     Vitamin D , Ergocalciferol , (DRISDOL ) 1.25 MG (50000 UNIT) CAPS capsule TAKE 1 CAPSULE BY MOUTH EVERY 7 DAYS 5 capsule 3   No facility-administered medications prior to visit.    No Known Allergies  Review of Systems  Constitutional:  Negative for appetite change, fatigue and fever.  HENT:  Negative for congestion, ear pain, sinus pressure and sore throat.   Respiratory:  Negative for cough, chest tightness, shortness of breath and wheezing.   Cardiovascular:  Negative for chest pain and palpitations.  Gastrointestinal:  Negative for abdominal pain, constipation, diarrhea, nausea and vomiting.  Genitourinary:  Negative for dysuria and hematuria.  Musculoskeletal:  Negative for arthralgias, back pain, joint swelling and myalgias.  Skin:  Negative for rash.  Neurological:  Positive for dizziness. Negative for weakness and headaches.  Psychiatric/Behavioral:  Negative for dysphoric mood. The patient is not nervous/anxious.        Objective:        07/19/2024   10:27 AM 07/12/2024    9:24 AM 06/06/2024   10:14 AM  Vitals with BMI  Height 5' 5.5 5' 5.5 5' 5.5  Weight 156 lbs 13 oz 157 lbs 160 lbs  BMI 25.69 25.72 26.21  Systolic 118 118 889  Diastolic 78 88 72  Pulse 64 78 92    Orthostatic VS for the past 72 hrs (Last 3 readings):  Patient Position BP Location  07/19/24 1027 Sitting Left Arm      Physical Exam Vitals reviewed.  Constitutional:      Appearance: Normal appearance.  HENT:     Right Ear: Hearing normal. No decreased hearing noted. Tenderness present. No middle ear effusion. There is mastoid tenderness. Tympanic membrane is injected and erythematous.     Left Ear: Hearing normal. No decreased hearing noted. No tenderness.  No middle ear effusion. There is no impacted cerumen. Tympanic membrane is not bulging.  Cardiovascular:     Rate and Rhythm: Normal rate and regular rhythm.  Heart sounds: Normal heart sounds.  Pulmonary:     Effort: Pulmonary effort is normal.     Breath sounds: Normal breath sounds.  Abdominal:     General: Bowel sounds are normal.     Palpations: Abdomen is soft.     Tenderness: There is no abdominal tenderness.  Neurological:     Mental Status: He is alert and oriented to person, place, and time.  Psychiatric:        Mood and Affect: Mood normal.        Behavior: Behavior normal.     Health Maintenance Due  Topic Date Due   Hepatitis C Screening  Never done   Pneumococcal Vaccine (1 of 2 - PCV) Never done   Hepatitis B Vaccines 19-59 Average Risk (1 of 3 - 19+ 3-dose series) Never done   Influenza Vaccine  04/29/2024       Topic Date Due   Hepatitis B Vaccines 19-59 Average Risk (1 of 3 - 19+ 3-dose series) Never done     Lab Results  Component Value Date   TSH 1.960 01/19/2024   Lab Results  Component Value Date   WBC 8.3 04/20/2024   HGB 15.8 04/20/2024   HCT 47.7 04/20/2024   MCV 91 04/20/2024   PLT 306 04/20/2024   Lab Results  Component Value Date   NA 141 04/20/2024   K 4.5 04/20/2024   CO2 22 04/20/2024   GLUCOSE 89 04/20/2024   BUN 15 04/20/2024   CREATININE 1.07 04/20/2024   BILITOT 1.4 (H) 04/20/2024   ALKPHOS 114 04/20/2024   AST 20 04/20/2024   ALT 20 04/20/2024   PROT 7.6 04/20/2024   ALBUMIN 4.9 04/20/2024   CALCIUM 9.9 04/20/2024   ANIONGAP 14 04/06/2018   EGFR 93 04/20/2024   GFR  104.85 10/29/2022   Lab Results  Component Value Date   CHOL 173 04/20/2024   Lab Results  Component Value Date   HDL 33 (L) 04/20/2024   Lab Results  Component Value Date   LDLCALC 117 (H) 04/20/2024   Lab Results  Component Value Date   TRIG 126 04/20/2024   Lab Results  Component Value Date   CHOLHDL 5.2 (H) 04/20/2024   Lab Results  Component Value Date   HGBA1C 5.3 01/19/2024        Results for orders placed or performed in visit on 04/20/24  CBC with Differential/Platelet   Collection Time: 04/20/24  9:39 AM  Result Value Ref Range   WBC 8.3 3.4 - 10.8 x10E3/uL   RBC 5.26 4.14 - 5.80 x10E6/uL   Hemoglobin 15.8 13.0 - 17.7 g/dL   Hematocrit 52.2 62.4 - 51.0 %   MCV 91 79 - 97 fL   MCH 30.0 26.6 - 33.0 pg   MCHC 33.1 31.5 - 35.7 g/dL   RDW 86.8 88.3 - 84.5 %   Platelets 306 150 - 450 x10E3/uL   Neutrophils 58 Not Estab. %   Lymphs 35 Not Estab. %   Monocytes 6 Not Estab. %   Eos 1 Not Estab. %   Basos 0 Not Estab. %   Neutrophils Absolute 4.8 1.4 - 7.0 x10E3/uL   Lymphocytes Absolute 2.9 0.7 - 3.1 x10E3/uL   Monocytes Absolute 0.5 0.1 - 0.9 x10E3/uL   EOS (ABSOLUTE) 0.1 0.0 - 0.4 x10E3/uL   Basophils Absolute 0.0 0.0 - 0.2 x10E3/uL   Immature Granulocytes 0 Not Estab. %   Immature Grans (Abs) 0.0 0.0 - 0.1 x10E3/uL  Comprehensive  metabolic panel with GFR   Collection Time: 04/20/24  9:39 AM  Result Value Ref Range   Glucose 89 70 - 99 mg/dL   BUN 15 6 - 20 mg/dL   Creatinine, Ser 8.92 0.76 - 1.27 mg/dL   eGFR 93 >40 fO/fpw/8.26   BUN/Creatinine Ratio 14 9 - 20   Sodium 141 134 - 144 mmol/L   Potassium 4.5 3.5 - 5.2 mmol/L   Chloride 103 96 - 106 mmol/L   CO2 22 20 - 29 mmol/L   Calcium 9.9 8.7 - 10.2 mg/dL   Total Protein 7.6 6.0 - 8.5 g/dL   Albumin 4.9 4.1 - 5.1 g/dL   Globulin, Total 2.7 1.5 - 4.5 g/dL   Bilirubin Total 1.4 (H) 0.0 - 1.2 mg/dL   Alkaline Phosphatase 114 44 - 121 IU/L   AST 20 0 - 40 IU/L   ALT 20 0 - 44 IU/L  Lipid  panel   Collection Time: 04/20/24  9:39 AM  Result Value Ref Range   Cholesterol, Total 173 100 - 199 mg/dL   Triglycerides 873 0 - 149 mg/dL   HDL 33 (L) >60 mg/dL   VLDL Cholesterol Cal 23 5 - 40 mg/dL   LDL Chol Calc (NIH) 882 (H) 0 - 99 mg/dL   Chol/HDL Ratio 5.2 (H) 0.0 - 5.0 ratio  VITAMIN D  25 Hydroxy (Vit-D Deficiency, Fractures)   Collection Time: 04/20/24  9:39 AM  Result Value Ref Range   Vit D, 25-Hydroxy 33.8 30.0 - 100.0 ng/mL     Assessment & Plan:   Assessment & Plan Other infective acute otitis externa of right ear Right external otitis with associated vertigo Intermittent vertigo with sharp, tingling pain in the right ear. Redness and inflammation of the external ear canal. Eardrum slightly red and swollen. No nerve involvement. Likely external ear infection, possibly exacerbated by allergies or congestion. - Prescribed antibiotic ear drops and prednisone  for five days. Discussed potential side effect of increased hunger with prednisone . - Referred to ENT specialist, Dr. Penne Killian, for further evaluation if symptoms persist or worsen. - Advised to monitor symptoms and report if pain worsens or dizziness increases by Friday. - Instructed to keep phone available for ENT appointment scheduling. Orders:   predniSONE  (DELTASONE ) 20 MG tablet; Take 3 tablets (60 mg total) by mouth daily with breakfast for 3 days, THEN 2 tablets (40 mg total) daily with breakfast for 3 days, THEN 1 tablet (20 mg total) daily with breakfast for 3 days.   ofloxacin (FLOXIN) 0.3 % OTIC solution; Place 5 drops into the right ear daily.   Ambulatory referral to ENT  Benign paroxysmal positional vertigo of right ear Epley Maneuver seemed to improve symptoms Continued to have symptoms Will send referral to ENT  Orders:   Ambulatory referral to ENT     Body mass index is 25.7 kg/m.  No orders of the defined types were placed in this encounter.   No orders of the defined types were  placed in this encounter.    Follow-up: No follow-ups on file.  An After Visit Summary was printed and given to the patient. Assessment and Plan   I,Lauren M Auman,acting as a scribe for [employer], PA.,have documented all relevant documentation on the behalf of Nola Angles, PA,as directed by  Nola Angles, PA while in the presence of Nola Angles, [place].    Nola Angles, [place] [facility] [phone]

## 2024-07-27 ENCOUNTER — Ambulatory Visit: Admitting: Physician Assistant

## 2024-07-27 ENCOUNTER — Encounter: Payer: Self-pay | Admitting: Physician Assistant

## 2024-07-27 VITALS — BP 128/78 | HR 105 | Temp 97.6°F | Ht 65.5 in | Wt 155.0 lb

## 2024-07-27 DIAGNOSIS — H6531 Chronic mucoid otitis media, right ear: Secondary | ICD-10-CM

## 2024-07-27 MED ORDER — CIPROFLOXACIN HCL 500 MG PO TABS: 500.0000 mg | ORAL_TABLET | Freq: Two times a day (BID) | ORAL | 0 refills | Status: DC

## 2024-07-27 NOTE — Progress Notes (Signed)
 Acute Office Visit  Subjective:    Patient ID: Paul Rush, male    DOB: 08-31-1991, 33 y.o.   MRN: [id number]  Chief Complaint  Patient presents with   Ear pain bilateral    HPI: Patient is in today for continued ear pain and possible ear infection progressing to the other ear.   Discussed the use of AI scribe software for clinical note transcription with the patient, who gave verbal consent to proceed.  History of Present Illness Paul Rush is a 33 year old male who presents with persistent ear and neck pain.  He has been experiencing persistent ear pain since Monday, with no drainage or hearing difficulties. The pain originates in the neck and radiates to the left ear, accompanied by occasional blurry vision. The pain is severe enough to wake him at night. No fever, sinus congestion, sore throat, or ear discharge.  He has a history of frequent ear infections during childhood, occurring as often as every other month. His maternal grandmother also had lifelong ear issues and required hearing aids.  He has been using ofloxacin ear drops on the right ear without relief. He was previously prescribed prednisone , which did not significantly alleviate his symptoms. He denies any recent swimming activities.  He has been experiencing headaches localized to the front of his head, in addition to the ear pain.    Past Medical History:  Diagnosis Date   Asthma     Past Surgical History:  Procedure Laterality Date   CHOLECYSTECTOMY     WISDOM TOOTH EXTRACTION     WISDOM TOOTH EXTRACTION      Family History  Problem Relation Age of Onset   Healthy Mother     Social History   Socioeconomic History   Marital status: Legally Separated    Spouse name: Not on file   Number of children: Not on file   Years of education: Not on file   Highest education level: Not on file  Occupational History   Not on file  Tobacco Use   Smoking status: Never   Smokeless  tobacco: Never  Vaping Use   Vaping status: Never Used  Substance and Sexual Activity   Alcohol use: Yes    Alcohol/week: 2.0 standard drinks of alcohol    Types: 2 Standard drinks or equivalent per week    Comment: Occa   Drug use: No   Sexual activity: Yes    Partners: Male  Other Topics Concern   Not on file  Social History Narrative   Drinks 1-2 cups of caffeine daily.   Social Drivers of Corporate Investment Banker Strain: Low Risk  (01/19/2024)   Overall Financial Resource Strain (CARDIA)    Difficulty of Paying Living Expenses: Not hard at all  Food Insecurity: No Food Insecurity (01/19/2024)   Hunger Vital Sign    Worried About Running Out of Food in the Last Year: Never true    Ran Out of Food in the Last Year: Never true  Transportation Needs: No Transportation Needs (01/19/2024)   PRAPARE - Administrator, Civil Service (Medical): No    Lack of Transportation (Non-Medical): No  Physical Activity: Insufficiently Active (01/19/2024)   Exercise Vital Sign    Days of Exercise per Week: 3 days    Minutes of Exercise per Session: 30 min  Stress: No Stress Concern Present (01/19/2024)   [employer] of Occupational Health - Occupational Stress Questionnaire    Feeling of Stress :  Not at all  Social Connections: Moderately Isolated (01/19/2024)   Social Connection and Isolation Panel    Frequency of Communication with Friends and Family: More than three times a week    Frequency of Social Gatherings with Friends and Family: More than three times a week    Attends Religious Services: More than 4 times per year    Active Member of [employer] or Organizations: No    Attends Banker Meetings: Never    Marital Status: Never married  Intimate Partner Violence: Not At Risk (01/19/2024)   Humiliation, Afraid, Rape, and Kick questionnaire    Fear of Current or Ex-Partner: No    Emotionally Abused: No    Physically Abused: No    Sexually Abused: No     Outpatient Medications Prior to Visit  Medication Sig Dispense Refill   cetirizine  (ZYRTEC ) 10 MG tablet Take 1 tablet (10 mg total) by mouth daily as needed for allergies. 30 tablet 0   clotrimazole -betamethasone  (LOTRISONE ) cream Apply 1 Application topically daily. 30 g 0   cyanocobalamin (VITAMIN B12) 1000 MCG tablet Take 1 tablet (1,000 mcg total) by mouth daily. 90 tablet 2   Efinaconazole  10 % SOLN Apply 1 drop topically daily. 4 mL 11   itraconazole  (SPORANOX ) 100 MG capsule Take 200mg  twice a day for one week, no medication for 3 weeks then repeat for 3 months 84 capsule 0   meclizine  (ANTIVERT ) 25 MG tablet Take 1 tablet (25 mg total) by mouth 3 (three) times daily as needed for dizziness. 30 tablet 0   ofloxacin (FLOXIN) 0.3 % OTIC solution Place 5 drops into the right ear daily. 10 mL 0   omeprazole  (PRILOSEC) 40 MG capsule Take 1 capsule (40 mg total) by mouth daily. 90 capsule 3   predniSONE  (DELTASONE ) 20 MG tablet Take 3 tablets (60 mg total) by mouth daily with breakfast for 3 days, THEN 2 tablets (40 mg total) daily with breakfast for 3 days, THEN 1 tablet (20 mg total) daily with breakfast for 3 days. 18 tablet 0   venlafaxine  XR (EFFEXOR -XR) 150 MG 24 hr capsule Take 150 mg by mouth daily with breakfast.     Vitamin D , Ergocalciferol , (DRISDOL ) 1.25 MG (50000 UNIT) CAPS capsule TAKE 1 CAPSULE BY MOUTH EVERY 7 DAYS 5 capsule 3   No facility-administered medications prior to visit.    No Known Allergies  Review of Systems  Constitutional:  Negative for appetite change, fatigue and fever.  HENT:  Positive for ear pain (Bilateral). Negative for congestion, sinus pressure and sore throat.   Respiratory:  Negative for cough, chest tightness, shortness of breath and wheezing.   Cardiovascular:  Negative for chest pain and palpitations.  Gastrointestinal:  Negative for abdominal pain, constipation, diarrhea, nausea and vomiting.  Genitourinary:  Negative for dysuria and  hematuria.  Musculoskeletal:  Negative for arthralgias, back pain, joint swelling and myalgias.  Skin:  Negative for rash.  Neurological:  Negative for dizziness, weakness and headaches.  Psychiatric/Behavioral:  Negative for dysphoric mood. The patient is not nervous/anxious.        Objective:        07/27/2024    1:20 PM 07/19/2024   10:27 AM 07/12/2024    9:24 AM  Vitals with BMI  Height 5' 5.5 5' 5.5 5' 5.5  Weight 155 lbs 156 lbs 13 oz 157 lbs  BMI 25.39 25.69 25.72  Systolic 128 118 881  Diastolic 78 78 88  Pulse 105 64 78  Orthostatic VS for the past 72 hrs (Last 3 readings):  Patient Position BP Location  07/27/24 1320 Sitting Right Arm     Physical Exam Vitals reviewed.  Constitutional:      Appearance: Normal appearance.  HENT:     Right Ear: Hearing normal. No decreased hearing noted. Tenderness present. No middle ear effusion. Tympanic membrane is not bulging.     Left Ear: Hearing normal. No decreased hearing noted. Tenderness present.  No middle ear effusion. Tympanic membrane is not bulging.     Ears:     Comments: Erythema in right canal no sign of infection.  Tenderness behind ear at air cells.  Cardiovascular:     Rate and Rhythm: Normal rate and regular rhythm.     Heart sounds: Normal heart sounds.  Pulmonary:     Effort: Pulmonary effort is normal.     Breath sounds: Normal breath sounds.  Abdominal:     General: Bowel sounds are normal.     Palpations: Abdomen is soft.     Tenderness: There is no abdominal tenderness.  Neurological:     Mental Status: He is alert and oriented to person, place, and time.  Psychiatric:        Mood and Affect: Mood normal.        Behavior: Behavior normal.     Health Maintenance Due  Topic Date Due   Hepatitis C Screening  Never done   Pneumococcal Vaccine (1 of 2 - PCV) Never done   Hepatitis B Vaccines 19-59 Average Risk (1 of 3 - 19+ 3-dose series) Never done   Influenza Vaccine  04/29/2024        Topic Date Due   Hepatitis B Vaccines 19-59 Average Risk (1 of 3 - 19+ 3-dose series) Never done     Lab Results  Component Value Date   TSH 1.960 01/19/2024   Lab Results  Component Value Date   WBC 8.3 04/20/2024   HGB 15.8 04/20/2024   HCT 47.7 04/20/2024   MCV 91 04/20/2024   PLT 306 04/20/2024   Lab Results  Component Value Date   NA 141 04/20/2024   K 4.5 04/20/2024   CO2 22 04/20/2024   GLUCOSE 89 04/20/2024   BUN 15 04/20/2024   CREATININE 1.07 04/20/2024   BILITOT 1.4 (H) 04/20/2024   ALKPHOS 114 04/20/2024   AST 20 04/20/2024   ALT 20 04/20/2024   PROT 7.6 04/20/2024   ALBUMIN 4.9 04/20/2024   CALCIUM 9.9 04/20/2024   ANIONGAP 14 04/06/2018   EGFR 93 04/20/2024   GFR 104.85 10/29/2022   Lab Results  Component Value Date   CHOL 173 04/20/2024   Lab Results  Component Value Date   HDL 33 (L) 04/20/2024   Lab Results  Component Value Date   LDLCALC 117 (H) 04/20/2024   Lab Results  Component Value Date   TRIG 126 04/20/2024   Lab Results  Component Value Date   CHOLHDL 5.2 (H) 04/20/2024   Lab Results  Component Value Date   HGBA1C 5.3 01/19/2024        Results for orders placed or performed in visit on 04/20/24  CBC with Differential/Platelet   Collection Time: 04/20/24  9:39 AM  Result Value Ref Range   WBC 8.3 3.4 - 10.8 x10E3/uL   RBC 5.26 4.14 - 5.80 x10E6/uL   Hemoglobin 15.8 13.0 - 17.7 g/dL   Hematocrit 52.2 62.4 - 51.0 %   MCV 91 79 - 97 fL  MCH 30.0 26.6 - 33.0 pg   MCHC 33.1 31.5 - 35.7 g/dL   RDW 86.8 88.3 - 84.5 %   Platelets 306 150 - 450 x10E3/uL   Neutrophils 58 Not Estab. %   Lymphs 35 Not Estab. %   Monocytes 6 Not Estab. %   Eos 1 Not Estab. %   Basos 0 Not Estab. %   Neutrophils Absolute 4.8 1.4 - 7.0 x10E3/uL   Lymphocytes Absolute 2.9 0.7 - 3.1 x10E3/uL   Monocytes Absolute 0.5 0.1 - 0.9 x10E3/uL   EOS (ABSOLUTE) 0.1 0.0 - 0.4 x10E3/uL   Basophils Absolute 0.0 0.0 - 0.2 x10E3/uL   Immature  Granulocytes 0 Not Estab. %   Immature Grans (Abs) 0.0 0.0 - 0.1 x10E3/uL  Comprehensive metabolic panel with GFR   Collection Time: 04/20/24  9:39 AM  Result Value Ref Range   Glucose 89 70 - 99 mg/dL   BUN 15 6 - 20 mg/dL   Creatinine, Ser 8.92 0.76 - 1.27 mg/dL   eGFR 93 >40 fO/fpw/8.26   BUN/Creatinine Ratio 14 9 - 20   Sodium 141 134 - 144 mmol/L   Potassium 4.5 3.5 - 5.2 mmol/L   Chloride 103 96 - 106 mmol/L   CO2 22 20 - 29 mmol/L   Calcium 9.9 8.7 - 10.2 mg/dL   Total Protein 7.6 6.0 - 8.5 g/dL   Albumin 4.9 4.1 - 5.1 g/dL   Globulin, Total 2.7 1.5 - 4.5 g/dL   Bilirubin Total 1.4 (H) 0.0 - 1.2 mg/dL   Alkaline Phosphatase 114 44 - 121 IU/L   AST 20 0 - 40 IU/L   ALT 20 0 - 44 IU/L  Lipid panel   Collection Time: 04/20/24  9:39 AM  Result Value Ref Range   Cholesterol, Total 173 100 - 199 mg/dL   Triglycerides 873 0 - 149 mg/dL   HDL 33 (L) >60 mg/dL   VLDL Cholesterol Cal 23 5 - 40 mg/dL   LDL Chol Calc (NIH) 882 (H) 0 - 99 mg/dL   Chol/HDL Ratio 5.2 (H) 0.0 - 5.0 ratio  VITAMIN D  25 Hydroxy (Vit-D Deficiency, Fractures)   Collection Time: 04/20/24  9:39 AM  Result Value Ref Range   Vit D, 25-Hydroxy 33.8 30.0 - 100.0 ng/mL     Assessment & Plan:   Assessment & Plan Chronic mucoid otitis media of right ear Recurrent otitis media and right otitis externa Persistent ear pain with redness and irritation. Previous treatment ineffective for otitis externa. No drainage or fever. Possible genetic predisposition for Otitis Media. - Prescribe ciprofloxacin  500 mg orally twice daily for 10 days. - Discontinue ofloxacin ear drops. - Advise taking probiotics during and after antibiotic course to maintain gut flora. - Instruct to report if no improvement by Friday. Orders:   ciprofloxacin  (CIPRO ) 500 MG tablet; Take 1 tablet (500 mg total) by mouth 2 (two) times daily for 7 days.    Follow-up Monitor response to treatment and consider further imaging if symptoms  persist. - Instruct to send a message through MyChart if symptoms do not improve by Friday. - Consider imaging if symptoms persist despite treatment. Body mass index is 25.4 kg/m.SABRA  No orders of the defined types were placed in this encounter.   No orders of the defined types were placed in this encounter.    Follow-up: No follow-ups on file.  An After Visit Summary was printed and given to the patient.   I,Lauren M Auman,acting as a neurosurgeon for  Nola Angles, PA.,have documented all relevant documentation on the behalf of Nola Angles, PA,as directed by  Nola Angles, PA while in the presence of Nola Angles, [place].    Nola Angles, [place] [facility] [phone]

## 2024-07-29 ENCOUNTER — Encounter: Payer: Self-pay | Admitting: Physician Assistant

## 2024-08-02 ENCOUNTER — Other Ambulatory Visit: Payer: Self-pay | Admitting: Physician Assistant

## 2024-08-02 ENCOUNTER — Encounter: Payer: Self-pay | Admitting: Physician Assistant

## 2024-08-02 DIAGNOSIS — H6531 Chronic mucoid otitis media, right ear: Secondary | ICD-10-CM

## 2024-08-02 MED ORDER — CIPROFLOXACIN HCL 500 MG PO TABS: 500.0000 mg | ORAL_TABLET | Freq: Two times a day (BID) | ORAL | 0 refills | Status: AC

## 2024-09-23 ENCOUNTER — Ambulatory Visit: Payer: Self-pay

## 2024-09-23 ENCOUNTER — Ambulatory Visit: Admitting: Physician Assistant

## 2024-09-23 VITALS — BP 118/82 | HR 91 | Temp 98.4°F | Ht 65.5 in | Wt 159.0 lb

## 2024-09-23 DIAGNOSIS — G8929 Other chronic pain: Secondary | ICD-10-CM | POA: Diagnosis not present

## 2024-09-23 DIAGNOSIS — H9209 Otalgia, unspecified ear: Secondary | ICD-10-CM

## 2024-09-23 DIAGNOSIS — R051 Acute cough: Secondary | ICD-10-CM | POA: Diagnosis not present

## 2024-09-23 LAB — POC COVID19 BINAXNOW: SARS Coronavirus 2 Ag: NEGATIVE

## 2024-09-23 LAB — POCT INFLUENZA A/B
Influenza A, POC: NEGATIVE
Influenza B, POC: NEGATIVE

## 2024-09-23 MED ORDER — DOXYCYCLINE HYCLATE 100 MG PO TABS: 100.0000 mg | ORAL_TABLET | Freq: Two times a day (BID) | ORAL | 0 refills | Status: AC

## 2024-09-23 MED ORDER — HYDROCOD POLI-CHLORPHE POLI ER 10-8 MG/5ML PO SUER: 5.0000 mL | Freq: Two times a day (BID) | ORAL | 0 refills | Status: AC | PRN

## 2024-09-23 MED ORDER — PREDNISONE 20 MG PO TABS: ORAL_TABLET | ORAL | 0 refills | Status: AC

## 2024-09-23 NOTE — Telephone Encounter (Signed)
 FYI Only or Action Required?: FYI only for provider: appointment scheduled on 09/23/24.  Patient was last seen in primary care on 07/27/2024 by Milon Cleaves, PA.  Called Nurse Triage reporting Cough, Fever, Nasal Congestion, and Generalized Body Aches.  Symptoms began 3 days ago.  Interventions attempted: OTC medications: Mucinex, Tylenol , ibuprofen.  Symptoms are: gradually worsening.  Triage Disposition: See Physician Within 24 Hours  Patient/caregiver understands and will follow disposition?: Yes              Copied from CRM [id number]. Topic: Clinical - Red Word Triage >> Sep 23, 2024  7:43 AM Charlet HERO wrote: Red Word that prompted transfer to Nurse Triage: Patient is calling he has fever and body aches with cough, for 24 hours. Cleaves Reason for Disposition  [1] Sinus pain (not just congestion) AND [2] fever  Answer Assessment - Initial Assessment Questions 1. ONSET: When did the nasal discharge start?      Tuesday.  2. AMOUNT: How much discharge is there?      Clear.  3. COUGH: Do you have a cough? If Yes, ask: Describe the color of your mucus. (e.g., clear, white, yellow, green)     Yes, dry.  4. RESPIRATORY DISTRESS: Describe your breathing.      Denies SOB.  5. FEVER: Do you have a fever? If Yes, ask: What is your temperature, how was it measured, and when did it start?     Yes, taken orally. Yesterday it was 101.7 and today 101.6  6. SEVERITY: Overall, how bad are you feeling right now? (e.g., doesn't interfere with normal activities, staying home from school/work, staying in bed)      He states he just woke up, hard to say. He was able to spend time with family yesterday.  7. OTHER SYMPTOMS: Do you have any other symptoms? (e.g., earache, mouth sores, sore throat, wheezing)     Sore throat, body aches, 6/10 sinus pain (head, nose, behind ears), nasal congestion.  Patient states he lives with his mother and she recently was sick with a  sinus infection and URI.  Protocols used: Common Cold-A-AH

## 2024-09-23 NOTE — Progress Notes (Signed)
 o  Acute Office Visit  Subjective:    Patient ID: Paul Rush, male    DOB: 1991/03/02, 33 y.o.   MRN: [id number]  Chief Complaint  Patient presents with   Cough    HPI: Patient is in today for cough and sinus congestion.   Discussed the use of AI scribe software for clinical note transcription with the patient, who gave verbal consent to proceed.  History of Present Illness Paul Rush is a 33 year old male who presents with fever, body aches, and shortness of breath.  Symptoms began on Tuesday night with a general feeling of unwellness, head congestion, and body aches, progressing to fever and shortness of breath. The highest recorded fever was 101.35F. He has been alternating between Tylenol  and ibuprofen, which has helped alleviate some of the aches and pains.  He has experienced ear pain, but a recent ENT evaluation confirmed that his ears and hearing are fine. He has been seeing a chiropractor for neck adjustments, which has been beneficial. No colored sputum has been produced. He has used Tindamax for cough suppression, which he found helpful, especially for sleep. Hot showers have been used to help clear his sinuses.  His mother, who had similar symptoms, started on doxycycline  and began feeling better. He has not had any positive COVID-19 or flu tests recently.     Past Medical History:  Diagnosis Date   Asthma     Past Surgical History:  Procedure Laterality Date   CHOLECYSTECTOMY     WISDOM TOOTH EXTRACTION     WISDOM TOOTH EXTRACTION      Family History  Problem Relation Age of Onset   Healthy Mother     Social History   Socioeconomic History   Marital status: Legally Separated    Spouse name: Not on file   Number of children: Not on file   Years of education: Not on file   Highest education level: Not on file  Occupational History   Not on file  Tobacco Use   Smoking status: Never   Smokeless tobacco: Never  Vaping Use   Vaping  status: Never Used  Substance and Sexual Activity   Alcohol use: Yes    Alcohol/week: 2.0 standard drinks of alcohol    Types: 2 Standard drinks or equivalent per week    Comment: Occa   Drug use: No   Sexual activity: Yes    Partners: Male  Other Topics Concern   Not on file  Social History Narrative   Drinks 1-2 cups of caffeine daily.   Social Drivers of Health   Tobacco Use: Low Risk (07/27/2024)   Patient History    Smoking Tobacco Use: Never    Smokeless Tobacco Use: Never    Passive Exposure: Not on file  Financial Resource Strain: Low Risk (01/19/2024)   Overall Financial Resource Strain (CARDIA)    Difficulty of Paying Living Expenses: Not hard at all  Food Insecurity: No Food Insecurity (01/19/2024)   Hunger Vital Sign    Worried About Running Out of Food in the Last Year: Never true    Ran Out of Food in the Last Year: Never true  Transportation Needs: No Transportation Needs (01/19/2024)   PRAPARE - Administrator, Civil Service (Medical): No    Lack of Transportation (Non-Medical): No  Physical Activity: Insufficiently Active (01/19/2024)   Exercise Vital Sign    Days of Exercise per Week: 3 days    Minutes of Exercise per  Session: 30 min  Stress: No Stress Concern Present (01/19/2024)   [employer] of Occupational Health - Occupational Stress Questionnaire    Feeling of Stress : Not at all  Social Connections: Moderately Isolated (01/19/2024)   Social Connection and Isolation Panel    Frequency of Communication with Friends and Family: More than three times a week    Frequency of Social Gatherings with Friends and Family: More than three times a week    Attends Religious Services: More than 4 times per year    Active Member of Clubs or Organizations: No    Attends Banker Meetings: Never    Marital Status: Never married  Intimate Partner Violence: Not At Risk (01/19/2024)   Humiliation, Afraid, Rape, and Kick questionnaire    Fear  of Current or Ex-Partner: No    Emotionally Abused: No    Physically Abused: No    Sexually Abused: No  Depression (PHQ2-9): Low Risk (07/19/2024)   Depression (PHQ2-9)    PHQ-2 Score: 3  Alcohol Screen: Low Risk (01/19/2024)   Alcohol Screen    Last Alcohol Screening Score (AUDIT): 1  Housing: Unknown (01/19/2024)   Housing Stability Vital Sign    Unable to Pay for Housing in the Last Year: No    Number of Times Moved in the Last Year: Not on file    Homeless in the Last Year: No  Utilities: Not At Risk (01/19/2024)   AHC Utilities    Threatened with loss of utilities: No  Health Literacy: Not on file    Outpatient Medications Prior to Visit  Medication Sig Dispense Refill   cetirizine  (ZYRTEC ) 10 MG tablet Take 1 tablet (10 mg total) by mouth daily as needed for allergies. 30 tablet 0   clotrimazole -betamethasone  (LOTRISONE ) cream Apply 1 Application topically daily. 30 g 0   cyanocobalamin  (VITAMIN B12) 1000 MCG tablet Take 1 tablet (1,000 mcg total) by mouth daily. 90 tablet 2   Efinaconazole  10 % SOLN Apply 1 drop topically daily. 4 mL 11   itraconazole  (SPORANOX ) 100 MG capsule Take 200mg  twice a day for one week, no medication for 3 weeks then repeat for 3 months 84 capsule 0   meclizine  (ANTIVERT ) 25 MG tablet Take 1 tablet (25 mg total) by mouth 3 (three) times daily as needed for dizziness. 30 tablet 0   ofloxacin  (FLOXIN ) 0.3 % OTIC solution Place 5 drops into the right ear daily. 10 mL 0   omeprazole  (PRILOSEC) 40 MG capsule Take 1 capsule (40 mg total) by mouth daily. 90 capsule 3   venlafaxine  XR (EFFEXOR -XR) 150 MG 24 hr capsule Take 150 mg by mouth daily with breakfast.     Vitamin D , Ergocalciferol , (DRISDOL ) 1.25 MG (50000 UNIT) CAPS capsule TAKE 1 CAPSULE BY MOUTH EVERY 7 DAYS 5 capsule 3   No facility-administered medications prior to visit.    Allergies[1]  Review of Systems  Constitutional:  Positive for fever. Negative for appetite change and fatigue.   HENT:  Positive for congestion. Negative for ear pain, sinus pressure and sore throat.   Respiratory:  Positive for cough. Negative for chest tightness, shortness of breath and wheezing.   Cardiovascular:  Negative for chest pain and palpitations.  Gastrointestinal:  Negative for abdominal pain, constipation, diarrhea, nausea and vomiting.  Genitourinary:  Negative for dysuria and hematuria.  Musculoskeletal:  Negative for arthralgias, back pain, joint swelling and myalgias.  Skin:  Negative for rash.  Neurological:  Negative for dizziness, weakness and headaches.  Psychiatric/Behavioral:  Negative for dysphoric mood. The patient is not nervous/anxious.        Objective:        07/27/2024    1:20 PM 07/19/2024   10:27 AM 07/12/2024    9:24 AM  Vitals with BMI  Height 5' 5.5 5' 5.5 5' 5.5  Weight 155 lbs 156 lbs 13 oz 157 lbs  BMI 25.39 25.69 25.72  Systolic 128 118 881  Diastolic 78 78 88  Pulse 105 64 78    No data found.   Physical Exam Vitals reviewed.  Constitutional:      Appearance: Normal appearance.  Cardiovascular:     Rate and Rhythm: Normal rate and regular rhythm.     Heart sounds: Normal heart sounds.  Pulmonary:     Effort: Pulmonary effort is normal.     Breath sounds: Normal breath sounds.  Abdominal:     General: Bowel sounds are normal.     Palpations: Abdomen is soft.     Tenderness: There is no abdominal tenderness.  Neurological:     Mental Status: He is alert and oriented to person, place, and time.  Psychiatric:        Mood and Affect: Mood normal.        Behavior: Behavior normal.     Health Maintenance Due  Topic Date Due   Hepatitis C Screening  Never done   Pneumococcal Vaccine (1 of 2 - PCV) Never done   Hepatitis B Vaccines 19-59 Average Risk (1 of 3 - 19+ 3-dose series) Never done   Influenza Vaccine  04/29/2024       Topic Date Due   Hepatitis B Vaccines 19-59 Average Risk (1 of 3 - 19+ 3-dose series) Never done      Lab Results  Component Value Date   TSH 1.960 01/19/2024   Lab Results  Component Value Date   WBC 8.3 04/20/2024   HGB 15.8 04/20/2024   HCT 47.7 04/20/2024   MCV 91 04/20/2024   PLT 306 04/20/2024   Lab Results  Component Value Date   NA 141 04/20/2024   K 4.5 04/20/2024   CO2 22 04/20/2024   GLUCOSE 89 04/20/2024   BUN 15 04/20/2024   CREATININE 1.07 04/20/2024   BILITOT 1.4 (H) 04/20/2024   ALKPHOS 114 04/20/2024   AST 20 04/20/2024   ALT 20 04/20/2024   PROT 7.6 04/20/2024   ALBUMIN 4.9 04/20/2024   CALCIUM 9.9 04/20/2024   ANIONGAP 14 04/06/2018   EGFR 93 04/20/2024   GFR 104.85 10/29/2022   Lab Results  Component Value Date   CHOL 173 04/20/2024   Lab Results  Component Value Date   HDL 33 (L) 04/20/2024   Lab Results  Component Value Date   LDLCALC 117 (H) 04/20/2024   Lab Results  Component Value Date   TRIG 126 04/20/2024   Lab Results  Component Value Date   CHOLHDL 5.2 (H) 04/20/2024   Lab Results  Component Value Date   HGBA1C 5.3 01/19/2024        Results for orders placed or performed in visit on 04/20/24  CBC with Differential/Platelet   Collection Time: 04/20/24  9:39 AM  Result Value Ref Range   WBC 8.3 3.4 - 10.8 x10E3/uL   RBC 5.26 4.14 - 5.80 x10E6/uL   Hemoglobin 15.8 13.0 - 17.7 g/dL   Hematocrit 52.2 62.4 - 51.0 %   MCV 91 79 - 97 fL   MCH 30.0 26.6 - 33.0  pg   MCHC 33.1 31.5 - 35.7 g/dL   RDW 86.8 88.3 - 84.5 %   Platelets 306 150 - 450 x10E3/uL   Neutrophils 58 Not Estab. %   Lymphs 35 Not Estab. %   Monocytes 6 Not Estab. %   Eos 1 Not Estab. %   Basos 0 Not Estab. %   Neutrophils Absolute 4.8 1.4 - 7.0 x10E3/uL   Lymphocytes Absolute 2.9 0.7 - 3.1 x10E3/uL   Monocytes Absolute 0.5 0.1 - 0.9 x10E3/uL   EOS (ABSOLUTE) 0.1 0.0 - 0.4 x10E3/uL   Basophils Absolute 0.0 0.0 - 0.2 x10E3/uL   Immature Granulocytes 0 Not Estab. %   Immature Grans (Abs) 0.0 0.0 - 0.1 x10E3/uL  Comprehensive metabolic panel  with GFR   Collection Time: 04/20/24  9:39 AM  Result Value Ref Range   Glucose 89 70 - 99 mg/dL   BUN 15 6 - 20 mg/dL   Creatinine, Ser 8.92 0.76 - 1.27 mg/dL   eGFR 93 >40 fO/fpw/8.26   BUN/Creatinine Ratio 14 9 - 20   Sodium 141 134 - 144 mmol/L   Potassium 4.5 3.5 - 5.2 mmol/L   Chloride 103 96 - 106 mmol/L   CO2 22 20 - 29 mmol/L   Calcium 9.9 8.7 - 10.2 mg/dL   Total Protein 7.6 6.0 - 8.5 g/dL   Albumin 4.9 4.1 - 5.1 g/dL   Globulin, Total 2.7 1.5 - 4.5 g/dL   Bilirubin Total 1.4 (H) 0.0 - 1.2 mg/dL   Alkaline Phosphatase 114 44 - 121 IU/L   AST 20 0 - 40 IU/L   ALT 20 0 - 44 IU/L  Lipid panel   Collection Time: 04/20/24  9:39 AM  Result Value Ref Range   Cholesterol, Total 173 100 - 199 mg/dL   Triglycerides 873 0 - 149 mg/dL   HDL 33 (L) >60 mg/dL   VLDL Cholesterol Cal 23 5 - 40 mg/dL   LDL Chol Calc (NIH) 882 (H) 0 - 99 mg/dL   Chol/HDL Ratio 5.2 (H) 0.0 - 5.0 ratio  VITAMIN D  25 Hydroxy (Vit-D Deficiency, Fractures)   Collection Time: 04/20/24  9:39 AM  Result Value Ref Range   Vit D, 25-Hydroxy 33.8 30.0 - 100.0 ng/mL     Assessment & Plan:   Assessment & Plan Acute cough Acute sinusitis with cough and fever Symptoms consistent with early sinusitis. COVID and flu tests negative. No pneumonia signs on auscultation. - Prescribed doxycycline . - Prescribed prednisone . - Recommended Tindamax for cough. - Advised hot showers for sinus clearance. - Instructed to continue coughing to clear secretions, avoid vomiting. - Sent prescriptions to [facility] in [place]. Orders:   POCT Influenza A/B   POC COVID-19 BinaxNow   doxycycline  (VIBRA -TABS) 100 MG tablet; Take 1 tablet (100 mg total) by mouth 2 (two) times daily.   predniSONE  (DELTASONE ) 20 MG tablet; Take 3 tablets (60 mg total) by mouth daily with breakfast for 3 days, THEN 2 tablets (40 mg total) daily with breakfast for 3 days, THEN 1 tablet (20 mg total) daily with breakfast for 3 days.    chlorpheniramine-HYDROcodone (TUSSIONEX) 10-8 MG/5ML; Take 5 mLs by mouth every 12 (twelve) hours as needed for cough.  Chronic ear pain, unspecified laterality Chronic ear pain ENT evaluation and hearing test normal. Chiropractic adjustments beneficial. - Continue chiropractic adjustments. - Monitor for changes in ear pain.      There is no height or weight on file to calculate BMI..  No orders of  the defined types were placed in this encounter.   No orders of the defined types were placed in this encounter.    Follow-up: No follow-ups on file.  An After Visit Summary was printed and given to the patient.   I,Lauren M Auman,acting as a neurosurgeon for [employer].,have documented all relevant documentation on the behalf of Nola Angles, PA,as directed by  Nola Angles, PA while in the presence of Nola Angles, [place].    Nola Angles, [place] [facility] [phone]     [1] No Known Allergies

## 2024-09-26 ENCOUNTER — Encounter: Payer: Self-pay | Admitting: Physician Assistant

## 2024-09-26 DIAGNOSIS — G8929 Other chronic pain: Secondary | ICD-10-CM | POA: Insufficient documentation

## 2024-09-26 NOTE — Assessment & Plan Note (Signed)
 Chronic ear pain ENT evaluation and hearing test normal. Chiropractic adjustments beneficial. - Continue chiropractic adjustments. - Monitor for changes in ear pain.

## 2024-10-24 ENCOUNTER — Ambulatory Visit: Admitting: Physician Assistant

## 2024-10-28 ENCOUNTER — Ambulatory Visit: Admitting: Physician Assistant

## 2024-10-28 ENCOUNTER — Encounter: Payer: Self-pay | Admitting: Physician Assistant

## 2024-10-28 VITALS — BP 104/60 | HR 97 | Temp 97.8°F | Resp 18 | Ht 65.5 in | Wt 162.9 lb

## 2024-10-28 DIAGNOSIS — E538 Deficiency of other specified B group vitamins: Secondary | ICD-10-CM | POA: Diagnosis not present

## 2024-10-28 DIAGNOSIS — R202 Paresthesia of skin: Secondary | ICD-10-CM | POA: Diagnosis not present

## 2024-10-28 DIAGNOSIS — J4520 Mild intermittent asthma, uncomplicated: Secondary | ICD-10-CM

## 2024-10-28 MED ORDER — ALBUTEROL SULFATE HFA 108 (90 BASE) MCG/ACT IN AERS: 2.0000 | INHALATION_SPRAY | Freq: Four times a day (QID) | RESPIRATORY_TRACT | 2 refills | Status: AC | PRN

## 2024-10-28 NOTE — Assessment & Plan Note (Signed)
 Vitamin B12 deficiency Low vitamin B12 levels with current oral supplementation. Symptoms of numbness and tingling could be related to B12 deficiency, but sharp pain suggests other causes. - Continue oral vitamin B12 supplementation. - Consider B12 injections if levels remain low. Orders:   B12 and Folate Panel

## 2024-10-28 NOTE — Assessment & Plan Note (Signed)
 Mild intermittent asthma Shortness of breath possibly exacerbated by cold weather and recent infection. No current inhaler use. - Prescribed albuterol  inhaler. - Instructed to rinse mouth after use. Orders:   albuterol  (VENTOLIN  HFA) 108 (90 Base) MCG/ACT inhaler; Inhale 2 puffs into the lungs every 6 (six) hours as needed for wheezing or shortness of breath.

## 2024-10-28 NOTE — Assessment & Plan Note (Signed)
 Evaluation for demyelinating disease (possible multiple sclerosis) Intermittent numbness, tingling, sharp pain, and weakness suggest possible MS. Differential includes MS, low-level shingles virus activity, and vitamin B12 deficiency. Blurry vision noted. No family history of autoimmune issues. No recent syphilis exposure. Neurology referral considered if inflammatory markers are positive. Discussed potential need for spinal tap if MS is suspected. - Ordered inflammatory markers. - Ordered RPR test for syphilis. - Referred to neurology if inflammatory markers are positive. - Consider head CT if inflammatory markers are negative and labs are normal. - Continue follow up with ophthalmologist in March. Orders:   B12 and Folate Panel   CBC with Differential/Platelet   Comprehensive metabolic panel with GFR   Hemoglobin A1c   Lipid panel   T4, free   TSH   Sedimentation rate   C-reactive protein   RPR

## 2024-10-29 LAB — CBC WITH DIFFERENTIAL/PLATELET
Basophils Absolute: 0 10*3/uL (ref 0.0–0.2)
Basos: 1 %
EOS (ABSOLUTE): 0.1 10*3/uL (ref 0.0–0.4)
Eos: 1 %
Hematocrit: 49.1 % (ref 37.5–51.0)
Hemoglobin: 16.4 g/dL (ref 13.0–17.7)
Immature Grans (Abs): 0 10*3/uL (ref 0.0–0.1)
Immature Granulocytes: 0 %
Lymphocytes Absolute: 2.6 10*3/uL (ref 0.7–3.1)
Lymphs: 35 %
MCH: 30.5 pg (ref 26.6–33.0)
MCHC: 33.4 g/dL (ref 31.5–35.7)
MCV: 91 fL (ref 79–97)
Monocytes Absolute: 0.5 10*3/uL (ref 0.1–0.9)
Monocytes: 7 %
Neutrophils Absolute: 4.3 10*3/uL (ref 1.4–7.0)
Neutrophils: 56 %
Platelets: 297 10*3/uL (ref 150–450)
RBC: 5.37 x10E6/uL (ref 4.14–5.80)
RDW: 13.6 % (ref 11.6–15.4)
WBC: 7.6 10*3/uL (ref 3.4–10.8)

## 2024-10-29 LAB — B12 AND FOLATE PANEL
Folate: 10 ng/mL
Vitamin B-12: 640 pg/mL (ref 232–1245)

## 2024-10-29 LAB — LIPID PANEL
Chol/HDL Ratio: 4.6 ratio (ref 0.0–5.0)
Cholesterol, Total: 208 mg/dL — ABNORMAL HIGH (ref 100–199)
HDL: 45 mg/dL
LDL Chol Calc (NIH): 145 mg/dL — ABNORMAL HIGH (ref 0–99)
Triglycerides: 102 mg/dL (ref 0–149)
VLDL Cholesterol Cal: 18 mg/dL (ref 5–40)

## 2024-10-29 LAB — COMPREHENSIVE METABOLIC PANEL WITH GFR
ALT: 17 [IU]/L (ref 0–44)
AST: 16 [IU]/L (ref 0–40)
Albumin: 5 g/dL (ref 4.1–5.1)
Alkaline Phosphatase: 95 [IU]/L (ref 47–123)
BUN/Creatinine Ratio: 17 (ref 9–20)
BUN: 19 mg/dL (ref 6–20)
Bilirubin Total: 1.2 mg/dL (ref 0.0–1.2)
CO2: 24 mmol/L (ref 20–29)
Calcium: 9.8 mg/dL (ref 8.7–10.2)
Chloride: 102 mmol/L (ref 96–106)
Creatinine, Ser: 1.11 mg/dL (ref 0.76–1.27)
Globulin, Total: 2.3 g/dL (ref 1.5–4.5)
Glucose: 90 mg/dL (ref 70–99)
Potassium: 4.5 mmol/L (ref 3.5–5.2)
Sodium: 140 mmol/L (ref 134–144)
Total Protein: 7.3 g/dL (ref 6.0–8.5)
eGFR: 89 mL/min/{1.73_m2}

## 2024-10-29 LAB — C-REACTIVE PROTEIN: CRP: 1 mg/L (ref 0–10)

## 2024-10-29 LAB — T4, FREE: Free T4: 1.32 ng/dL (ref 0.82–1.77)

## 2024-10-29 LAB — HEMOGLOBIN A1C
Est. average glucose Bld gHb Est-mCnc: 108 mg/dL
Hgb A1c MFr Bld: 5.4 % (ref 4.8–5.6)

## 2024-10-29 LAB — SEDIMENTATION RATE: Sed Rate: 8 mm/h (ref 0–15)

## 2024-10-29 LAB — TSH: TSH: 2.45 u[IU]/mL (ref 0.450–4.500)

## 2024-10-29 LAB — SYPHILIS: RPR W/REFLEX TO RPR TITER AND TREPONEMAL ANTIBODIES, TRADITIONAL SCREENING AND DIAGNOSIS ALGORITHM: RPR Ser Ql: NONREACTIVE

## 2024-11-01 ENCOUNTER — Ambulatory Visit: Payer: Self-pay | Admitting: Physician Assistant

## 2024-11-01 DIAGNOSIS — R202 Paresthesia of skin: Secondary | ICD-10-CM

## 2024-11-01 DIAGNOSIS — B0229 Other postherpetic nervous system involvement: Secondary | ICD-10-CM

## 2025-01-27 ENCOUNTER — Ambulatory Visit: Admitting: Physician Assistant
